# Patient Record
Sex: Male | Born: 1980 | Race: White | Hispanic: No | Marital: Married | Smoking: Former smoker
Health system: Southern US, Community
[De-identification: ages and names within clinical notes are randomized; demographics above are authoritative.]

## PROBLEM LIST (undated history)

## (undated) DIAGNOSIS — G47419 Narcolepsy without cataplexy: Secondary | ICD-10-CM

## (undated) DIAGNOSIS — N433 Hydrocele, unspecified: Secondary | ICD-10-CM

## (undated) DIAGNOSIS — I10 Essential (primary) hypertension: Secondary | ICD-10-CM

## (undated) DIAGNOSIS — G43909 Migraine, unspecified, not intractable, without status migrainosus: Secondary | ICD-10-CM

## (undated) DIAGNOSIS — M5020 Other cervical disc displacement, unspecified cervical region: Secondary | ICD-10-CM

## (undated) DIAGNOSIS — E785 Hyperlipidemia, unspecified: Secondary | ICD-10-CM

## (undated) DIAGNOSIS — M109 Gout, unspecified: Principal | ICD-10-CM

## (undated) HISTORY — DX: Gout, unspecified: M10.9

## (undated) HISTORY — PX: TONSILLECTOMY AND ADENOIDECTOMY: SUR1326

## (undated) HISTORY — DX: Narcolepsy without cataplexy: G47.419

## (undated) HISTORY — DX: Other cervical disc displacement, unspecified cervical region: M50.20

## (undated) HISTORY — PX: COSMETIC SURGERY: SHX468

## (undated) HISTORY — DX: Essential (primary) hypertension: I10

## (undated) HISTORY — DX: Migraine, unspecified, not intractable, without status migrainosus: G43.909

## (undated) HISTORY — DX: Hyperlipidemia, unspecified: E78.5

## (undated) HISTORY — DX: Hydrocele, unspecified: N43.3

---
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
## 2001-03-16 ENCOUNTER — Emergency Department (HOSPITAL_COMMUNITY): Admission: EM | Admit: 2001-03-16 | Discharge: 2001-03-16 | Payer: Self-pay | Admitting: Emergency Medicine

## 2001-06-04 ENCOUNTER — Ambulatory Visit (HOSPITAL_COMMUNITY): Admission: RE | Admit: 2001-06-04 | Discharge: 2001-06-04 | Payer: Self-pay | Admitting: *Deleted

## 2001-06-04 ENCOUNTER — Encounter: Payer: Self-pay | Admitting: *Deleted

## 2001-07-14 ENCOUNTER — Encounter: Payer: Self-pay | Admitting: Orthopedic Surgery

## 2001-07-14 ENCOUNTER — Ambulatory Visit (HOSPITAL_COMMUNITY): Admission: RE | Admit: 2001-07-14 | Discharge: 2001-07-14 | Payer: Self-pay | Admitting: Orthopedic Surgery

## 2003-09-17 ENCOUNTER — Emergency Department (HOSPITAL_COMMUNITY): Admission: EM | Admit: 2003-09-17 | Discharge: 2003-09-17 | Payer: Self-pay

## 2007-03-24 ENCOUNTER — Encounter: Admission: RE | Admit: 2007-03-24 | Discharge: 2007-03-24 | Payer: Self-pay | Admitting: Family Medicine

## 2007-10-15 ENCOUNTER — Encounter: Admission: RE | Admit: 2007-10-15 | Discharge: 2007-10-15 | Payer: Self-pay | Admitting: Specialist

## 2010-10-04 ENCOUNTER — Ambulatory Visit: Payer: Self-pay | Admitting: Specialist

## 2010-11-05 ENCOUNTER — Encounter (INDEPENDENT_AMBULATORY_CARE_PROVIDER_SITE_OTHER): Payer: BC Managed Care – PPO | Admitting: Vascular Surgery

## 2010-11-05 DIAGNOSIS — I774 Celiac artery compression syndrome: Secondary | ICD-10-CM

## 2010-11-06 NOTE — Consult Note (Signed)
NEW PATIENT CONSULTATION  Roger Brown, Roger Brown DOB:  1980/08/19                                       11/05/2010 NWGNF#:[id number]  Patient presents today for discussion regarding recent MRI findings suggesting celiac artery stenosis.  Patient is a very pleasant 30 year old gentleman who works as a Advice worker.  He has a history of hypertension since the [place] age of 22 and underwent an MRA of age 5 suggesting a possible mild renal artery stenosis.  He has had recent ongoing evaluation of hypertension and also migraine headaches and underwent a recent repeat MRA to determine if he was having progression of renal artery stenosis.  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  No other abnormalities were noted.  He does not have any specific symptoms referable to mesenteric ischemia, specifically food fear.  He does have occasional abdominal discomfort.  PAST MEDICAL HISTORY:  Significant for elevated blood pressure.  Only prior surgery was wisdom teeth extraction.  SOCIAL HISTORY:  He is married and does not have children.  He works as a Advice worker.  Quit smoking 5 years ago.  Does not drink alcohol.  FAMILY HISTORY:  Significant for transient ischemic attack in his mother and carotid artery stenosis in his father.  REVIEW OF SYSTEMS:  Reports a weight gain up to 213 pounds.  He is 5 feet 8 inches tall.  He does have some discomfort in his legs with walking. NEUROLOGIC:  Positive for headaches. GU:  No frequent urination. MUSCULOSKELETAL:  Arthritis, joint pain.  PHYSICAL EXAMINATION:  A well-developed and well-nourished white male appearing stated age in no acute distress.  Blood pressure is 147/101, pulse 99, respirations 18.  He is in no acute distress.  His abdomen is soft, nontender.  I do not hear a bruit or palpate any masses.  His radial, femoral, and pedal pulses  are 2+ bilaterally.  Neurologic:  No focal weakness or paresthesias.  Skin without ulcers or rashes. Musculoskeletal shows no major deformity or cyanosis.  I did have his MRA for review, and I have reviewed his actual films.  I would agree there is no evidence of renal artery stenosis, and there is some suggestion of potential proximal celiac artery stenosis.  On the lateral projections, this does appear to be compression by the median arcuate ligament.  I discussed this at length with patient and his wife present.  I explained that this would most likely be asymptomatic and would not recommend any further evaluation unless he had progressive GI symptoms. I did explain the next imaging study that I would recommend would be catheter-based arteriogram, feeling this would give more formation than a CT angiogram.  I do feel that this is asymptomatic and would only recommend further evaluation should he develop symptoms of mesenteric ischemia.  We will see him again on an as-needed basis.  He was reassured with this discussion.    Larina Earthly, M.D. Electronically Signed  TFE/MEDQ  D:  11/05/2010  T:  11/06/2010  Job:  [id number]

## 2011-08-29 ENCOUNTER — Other Ambulatory Visit: Payer: Self-pay | Admitting: Specialist

## 2011-08-29 LAB — CBC WITH DIFFERENTIAL/PLATELET
Basophil %: 0.6 %
Eosinophil #: 0 10*3/uL (ref 0.0–0.7)
Eosinophil %: 1 %
HCT: 42.3 % (ref 40.0–52.0)
HGB: 14.1 g/dL (ref 13.0–18.0)
Lymphocyte #: 1.5 10*3/uL (ref 1.0–3.6)
Lymphocyte %: 39 %
MCH: 29.7 pg (ref 26.0–34.0)
MCHC: 33.4 g/dL (ref 32.0–36.0)
Monocyte #: 0.2 10*3/uL (ref 0.0–0.7)
Neutrophil #: 2.3 10*3/uL (ref 1.4–6.5)

## 2011-08-29 LAB — PROTIME-INR
INR: 1
Prothrombin Time: 13.2 secs (ref 11.5–14.7)

## 2013-06-30 ENCOUNTER — Ambulatory Visit (INDEPENDENT_AMBULATORY_CARE_PROVIDER_SITE_OTHER): Payer: 59 | Admitting: Family Medicine

## 2013-06-30 ENCOUNTER — Encounter: Payer: Self-pay | Admitting: Family Medicine

## 2013-06-30 VITALS — BP 126/80 | HR 88 | Temp 98.9°F | Ht 68.0 in | Wt 203.5 lb

## 2013-06-30 DIAGNOSIS — G47419 Narcolepsy without cataplexy: Secondary | ICD-10-CM

## 2013-06-30 DIAGNOSIS — E669 Obesity, unspecified: Secondary | ICD-10-CM | POA: Insufficient documentation

## 2013-06-30 DIAGNOSIS — G43909 Migraine, unspecified, not intractable, without status migrainosus: Secondary | ICD-10-CM

## 2013-06-30 DIAGNOSIS — I10 Essential (primary) hypertension: Secondary | ICD-10-CM

## 2013-06-30 DIAGNOSIS — M109 Gout, unspecified: Secondary | ICD-10-CM

## 2013-06-30 MED ORDER — LOSARTAN POTASSIUM 50 MG PO TABS: 50.0000 mg | ORAL_TABLET | ORAL | Status: DC

## 2013-06-30 MED ORDER — HYDROCHLOROTHIAZIDE 25 MG PO TABS: 25.0000 mg | ORAL_TABLET | Freq: Every day | ORAL | Status: DC

## 2013-06-30 NOTE — Progress Notes (Signed)
Pre-visit discussion using our clinic review tool. No additional management support is needed unless otherwise documented below in the visit note.  

## 2013-06-30 NOTE — Patient Instructions (Signed)
CPX in 6 months 

## 2013-07-01 ENCOUNTER — Encounter: Payer: Self-pay | Admitting: Family Medicine

## 2013-07-01 DIAGNOSIS — G43909 Migraine, unspecified, not intractable, without status migrainosus: Secondary | ICD-10-CM

## 2013-07-01 DIAGNOSIS — Z8669 Personal history of other diseases of the nervous system and sense organs: Secondary | ICD-10-CM

## 2013-07-01 DIAGNOSIS — M109 Gout, unspecified: Secondary | ICD-10-CM | POA: Insufficient documentation

## 2013-07-01 DIAGNOSIS — G47419 Narcolepsy without cataplexy: Secondary | ICD-10-CM | POA: Insufficient documentation

## 2013-07-01 DIAGNOSIS — I10 Essential (primary) hypertension: Secondary | ICD-10-CM | POA: Insufficient documentation

## 2013-07-01 HISTORY — DX: Personal history of other diseases of the nervous system and sense organs: Z86.69

## 2013-07-01 HISTORY — DX: Gout, unspecified: M10.9

## 2013-07-01 HISTORY — DX: Migraine, unspecified, not intractable, without status migrainosus: G43.909

## 2013-07-01 NOTE — Progress Notes (Signed)
Date:  06/30/2013   Name:  Roger Brown   DOB:  31-Jan-1981   MRN:  [id number] Gender: male Age: 33 y.o.  Primary Physician:  Hannah Beat, MD   Chief Complaint: Establish Care   Subjective:   History of Present Illness:  Roger Brown is a 33 y.o. pleasant patient who presents with the following:  The patient is here to establish care as a new patient. He is relatively in good health. Does have hypertension that is well controlled on hydrochlorothiazide 25 mg as well as Cozaar 50 mg.  He also has migraines, and he does see his neurologist at least once or twice a year. He is currently on Depakote for migraine prophylaxis as well as Relpax.  He also has been troubled by gallops within the last 1-2 years significantly, and he currently intermittently takes Colcrys and Indocin. He does not use any type of gout prophylaxis, and he has never tried allopurinol, Uloric, or any of the others. He has been having some symptoms of gout almost continuously.  He also has narcolepsy, which was diagnosed by his neurologist after a great deal of testing. He is currently relatively well controlled on Ritalin 15 mg twice a day. He really did poorly when he tried Nuvigil and some of the other medications.  Patient Active Problem List   Diagnosis Date Noted  . Gout 07/01/2013  . Migraine 07/01/2013  . Unspecified essential hypertension 07/01/2013  . Narcolepsy 07/01/2013  . Obesity (BMI 30-39.9) 06/30/2013    Past Medical History  Diagnosis Date  . Hypertension   . Narcolepsy   . Hydrocele   . Herniated disc, cervical   . Gout 07/01/2013  . Migraine 07/01/2013    Past Surgical History  Procedure Laterality Date  . Cosmetic surgery    . Tonsillectomy and adenoidectomy      History   Social History  . Marital Status: Married    Spouse Name: N/A    Number of Children: N/A  . Years of Education: N/A   Occupational History  . nuclear tech [employer]    Social History Main Topics  . Smoking status: Former Smoker    Types: Cigarettes  . Smokeless tobacco: Never Used  . Alcohol Use: No  . Drug Use: No  . Sexual Activity: Yes    Partners: Female   Other Topics Concern  . Not on file   Social History Narrative   Former patient of Dr. Prince Rome          Family History  Problem Relation Age of Onset  . Arthritis Mother   . Arthritis Father   . Diabetes Father   . Lupus Father   . Gout Father   . Arthritis Maternal Grandmother   . Diabetes Maternal Grandmother   . Arthritis Maternal Grandfather   . Gout Maternal Grandfather   . Arthritis Paternal Grandmother   . Arthritis Paternal Grandfather   . Gout Brother   . Gout Brother     Allergies  Allergen Reactions  . Amoxicillin Hives and Swelling    Medication list has been reviewed and updated.  Review of Systems:   GEN: No acute illnesses, no fevers, chills. GI: No n/v/d, eating normally Pulm: No SOB Interactive and getting along well at home.  Otherwise, ROS is as per the HPI.  Objective:   Physical Examination: BP 126/80  Pulse 88  Temp(Src) 98.9 F (37.2 C) (Oral)  Ht 5\' 8"  (  1.727 m)  Wt 203 lb 8 oz (92.307 kg)  BMI 30.95 kg/m2  Ideal Body Weight: Weight in (lb) to have BMI = 25: 164.1   GEN: WDWN, NAD, Non-toxic, A & O x 3 HEENT: Atraumatic, Normocephalic. Neck supple. No masses, No LAD. Ears and Nose: No external deformity. CV: RRR, No M/G/R. No JVD. No thrill. No extra heart sounds. PULM: CTA B, no wheezes, crackles, rhonchi. No retractions. No resp. distress. No accessory muscle use. EXTR: No c/c/e NEURO Normal gait.  PSYCH: Normally interactive. Conversant. Not depressed or anxious appearing.  Calm demeanor.   Laboratory and Imaging Data:  Assessment & Plan:    Gout  Migraine  Unspecified essential hypertension  Narcolepsy  Refills given  Overall stable  Patient Instructions  CPX in 6 months   No orders of the defined  types were placed in this encounter.    New medications, updates to list, dose adjustments: Meds ordered this encounter  Medications  . divalproex (DEPAKOTE) 500 MG DR tablet    Sig: Take 1,000 mg by mouth at bedtime.  Marland Kitchen. DISCONTD: losartan (COZAAR) 50 MG tablet    Sig: Take 50 mg by mouth every morning.  Marland Kitchen. DISCONTD: hydrochlorothiazide (HYDRODIURIL) 25 MG tablet    Sig: Take 25 mg by mouth every morning.  . methylphenidate (RITALIN) 10 MG tablet    Sig: Take 15 mg by mouth 2 (two) times daily.  . colchicine (COLCRYS) 0.6 MG tablet    Sig: Take 0.6 mg by mouth as needed.  . indomethacin (INDOCIN) 50 MG capsule    Sig: Take 50 mg by mouth as needed.  . traMADol (ULTRAM) 50 MG tablet    Sig: Take 50 mg by mouth as needed.  . eletriptan (RELPAX) 40 MG tablet    Sig: Take 40 mg by mouth as needed for migraine or headache. One tablet by mouth at onset of headache. May repeat in 2 hours if headache persists or recurs.  . vitamin C (ASCORBIC ACID) 500 MG tablet    Sig: Take 500 mg by mouth 2 (two) times daily.  . hydrochlorothiazide (HYDRODIURIL) 25 MG tablet    Sig: Take 1 tablet (25 mg total) by mouth daily.    Dispense:  90 tablet    Refill:  3  . losartan (COZAAR) 50 MG tablet    Sig: Take 1 tablet (50 mg total) by mouth every morning.    Dispense:  90 tablet    Refill:  3    Signed,  Lemmie Vanlanen T. Karri Kallenbach, MD, CAQ Sports Medicine  [facility] [address] [place] [address] Phone: [phone] Fax: [phone]    Medication List       This list is accurate as of: 06/30/13 11:59 PM.  Always use your most recent med list.               COLCRYS 0.6 MG tablet  Generic drug:  colchicine  Take 0.6 mg by mouth as needed.     divalproex 500 MG DR tablet  Commonly known as:  DEPAKOTE  Take 1,000 mg by mouth at bedtime.     eletriptan 40 MG tablet  Commonly known as:  RELPAX  Take 40 mg by mouth as needed for migraine or headache. One tablet  by mouth at onset of headache. May repeat in 2 hours if headache persists or recurs.     hydrochlorothiazide 25 MG tablet  Commonly known as:  HYDRODIURIL  Take 1  tablet (25 mg total) by mouth daily.     indomethacin 50 MG capsule  Commonly known as:  INDOCIN  Take 50 mg by mouth as needed.     losartan 50 MG tablet  Commonly known as:  COZAAR  Take 1 tablet (50 mg total) by mouth every morning.     methylphenidate 10 MG tablet  Commonly known as:  RITALIN  Take 15 mg by mouth 2 (two) times daily.     traMADol 50 MG tablet  Commonly known as:  ULTRAM  Take 50 mg by mouth as needed.     vitamin C 500 MG tablet  Commonly known as:  ASCORBIC ACID  Take 500 mg by mouth 2 (two) times daily.

## 2013-08-27 ENCOUNTER — Encounter: Payer: Self-pay | Admitting: Family Medicine

## 2013-08-29 ENCOUNTER — Encounter: Payer: Self-pay | Admitting: Family Medicine

## 2013-08-29 MED ORDER — METHYLPHENIDATE HCL 10 MG PO TABS: 15.0000 mg | ORAL_TABLET | Freq: Two times a day (BID) | ORAL | Status: DC

## 2013-08-29 NOTE — Telephone Encounter (Signed)
Left message for Ivin BootyJoshua that prescription is ready to be picked up at front desk.

## 2013-08-29 NOTE — Addendum Note (Signed)
Addended by: Damita LackLORING, DONNA S on: 08/29/2013 03:29 PM   Modules accepted: Orders

## 2013-09-09 ENCOUNTER — Ambulatory Visit: Payer: Self-pay

## 2013-09-20 ENCOUNTER — Encounter: Payer: Self-pay | Admitting: Family Medicine

## 2013-09-21 ENCOUNTER — Other Ambulatory Visit: Payer: Self-pay | Admitting: Family Medicine

## 2013-09-21 MED ORDER — METHYLPHENIDATE HCL 10 MG PO TABS: 15.0000 mg | ORAL_TABLET | Freq: Two times a day (BID) | ORAL | Status: DC

## 2013-09-21 NOTE — Telephone Encounter (Signed)
Pt missed call; advised pt per Dr Cyndie Chimeopland's CMA ritalin rx ready for pick up at front desk.pt voiced understanding.

## 2013-09-28 ENCOUNTER — Telehealth: Payer: Self-pay

## 2013-09-28 ENCOUNTER — Encounter: Payer: Self-pay | Admitting: Family Medicine

## 2013-09-28 NOTE — Telephone Encounter (Signed)
i will call him tomorrow  

## 2013-09-28 NOTE — Telephone Encounter (Signed)
Pt left v/m; pt has been taking Ritalin for narcolepsy; pt wants to know if there is another med pt could take for narcolepsy; pt does not feel like Ritalin is effective all the time and pt "feels bad" a lot of the time; pt has periods that heart races; pt is monitoring BP an BP is OK. Pt request cb.

## 2013-09-29 MED ORDER — MODAFINIL 200 MG PO TABS: 200.0000 mg | ORAL_TABLET | Freq: Every day | ORAL | Status: DC

## 2013-09-29 NOTE — Telephone Encounter (Signed)
Unable to reach. Sent a message via MyChart

## 2013-09-29 NOTE — Telephone Encounter (Signed)
Called in and sent patient mychart message.

## 2013-09-29 NOTE — Addendum Note (Signed)
Addended by: Hannah BeatOPLAND, Kyaire Gruenewald on: 09/29/2013 07:43 PM   Modules accepted: Orders

## 2013-09-29 NOTE — Telephone Encounter (Signed)
Pt left v/m returning call from Dr Patsy Lageropland; pt said can return call at [phone] or if Dr Patsy Lageropland wants to proceed with med change [facility]pt request cb.

## 2013-09-29 NOTE — Telephone Encounter (Signed)
Taken care of

## 2013-11-01 ENCOUNTER — Encounter: Payer: Self-pay | Admitting: Family Medicine

## 2013-11-01 ENCOUNTER — Ambulatory Visit (INDEPENDENT_AMBULATORY_CARE_PROVIDER_SITE_OTHER): Payer: 59 | Admitting: Family Medicine

## 2013-11-01 VITALS — BP 122/84 | HR 71 | Temp 98.7°F | Ht 68.0 in | Wt 203.5 lb

## 2013-11-01 DIAGNOSIS — J029 Acute pharyngitis, unspecified: Secondary | ICD-10-CM

## 2013-11-01 LAB — POCT RAPID STREP A (OFFICE): Rapid Strep A Screen: NEGATIVE

## 2013-11-01 MED ORDER — GUAIFENESIN-CODEINE 100-10 MG/5ML PO SYRP: 5.0000 mL | ORAL_SOLUTION | Freq: Every evening | ORAL | Status: DC | PRN

## 2013-11-01 NOTE — Patient Instructions (Signed)
Ibuprofen 800 mg three times a day. Push fluids. Rest. Cough suppressant at night. Return to work 24 hours after fever resolved. Call if fever persistent > 4 days, shortness of breath.

## 2013-11-01 NOTE — Progress Notes (Signed)
Pre visit review using our clinic review tool, if applicable. No additional management support is needed unless otherwise documented below in the visit note. 

## 2013-11-01 NOTE — Progress Notes (Signed)
   Subjective:    Patient ID: Roger HitchJoshua B Vasey, male    DOB: 1981-03-04, 33 y.o.   MRN: [id number]  Sore Throat  This is a new problem. The current episode started yesterday. The problem has been gradually worsening. Neither side of throat is experiencing more pain than the other. The maximum temperature recorded prior to his arrival was 101 - 101.9 F. The fever has been present for less than 1 day. The pain is moderate. Associated symptoms include coughing, swollen glands and trouble swallowing. Pertinent negatives include no abdominal pain, ear discharge, hoarse voice, plugged ear sensation or shortness of breath. Associated symptoms comments: Myalgia, fatigue, sweats  mild nasal congestion  headache  no sinus pain  cough keeping up at night. He has had no exposure to strep or mono. 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 He has tried acetaminophen for the symptoms. The treatment provided no relief.      Review of Systems  Constitutional: Positive for fever and fatigue.  HENT: Positive for trouble swallowing. Negative for ear discharge and hoarse voice.   Respiratory: Positive for cough. Negative for shortness of breath and wheezing.   Cardiovascular: Negative for chest pain and leg swelling.  Gastrointestinal: Negative for abdominal pain.       Objective:   Physical Exam  Constitutional: Vital signs are normal. He appears well-developed and well-nourished.  Non-toxic appearance. He does not appear ill. No distress.  HENT:  Head: Normocephalic and atraumatic.  Right Ear: Hearing, tympanic membrane, external ear and ear canal normal. No tenderness. No foreign bodies. Tympanic membrane is not retracted and not bulging.  Left Ear: Hearing, tympanic membrane, external ear and ear canal normal. No tenderness. No foreign bodies. Tympanic membrane is not retracted and not bulging.  Nose: Nose normal. No mucosal edema or rhinorrhea.  Right sinus exhibits no maxillary sinus tenderness and no frontal sinus tenderness. Left sinus exhibits no maxillary sinus tenderness and no frontal sinus tenderness.  Mouth/Throat: Uvula is midline and mucous membranes are normal. Normal dentition. No dental caries. Posterior oropharyngeal erythema present. No oropharyngeal exudate, posterior oropharyngeal edema or tonsillar abscesses.  Eyes: Conjunctivae, EOM and lids are normal. Pupils are equal, round, and reactive to light. Lids are everted and swept, no foreign bodies found.  Neck: Trachea normal, normal range of motion and phonation normal. Neck supple. Carotid bruit is not present. No mass and no thyromegaly present.  Cardiovascular: Normal rate, regular rhythm, S1 normal, S2 normal, normal heart sounds, intact distal pulses and normal pulses.  Exam reveals no gallop.   No murmur heard. Pulmonary/Chest: Effort normal and breath sounds normal. No respiratory distress. He has no wheezes. He has no rhonchi. He has no rales.  Abdominal: Soft. Normal appearance and bowel sounds are normal. There is no hepatosplenomegaly. There is no tenderness. There is no rebound, no guarding and no CVA tenderness. No hernia.  Neurological: He is alert. He has normal reflexes.  Skin: Skin is warm, dry and intact. No rash noted.  Psychiatric: He has a normal mood and affect. His speech is normal and behavior is normal. Judgment normal.          Assessment & Plan:

## 2013-12-20 ENCOUNTER — Telehealth: Payer: Self-pay | Admitting: Family Medicine

## 2013-12-20 ENCOUNTER — Other Ambulatory Visit: Payer: Self-pay | Admitting: Family Medicine

## 2013-12-20 DIAGNOSIS — G43809 Other migraine, not intractable, without status migrainosus: Secondary | ICD-10-CM

## 2013-12-20 NOTE — Telephone Encounter (Signed)
Pt states that his neurologist's office called him yesterday and said since his insurance has changed, he heeds a referral for his appt on 12/29/13.  [place]

## 2013-12-20 NOTE — Telephone Encounter (Signed)
done

## 2013-12-21 ENCOUNTER — Other Ambulatory Visit: Payer: 59

## 2013-12-28 ENCOUNTER — Encounter: Payer: 59 | Admitting: Family Medicine

## 2013-12-30 ENCOUNTER — Encounter: Payer: Self-pay | Admitting: Family Medicine

## 2014-02-23 ENCOUNTER — Other Ambulatory Visit: Payer: Self-pay | Admitting: Family Medicine

## 2014-02-23 DIAGNOSIS — Z13220 Encounter for screening for lipoid disorders: Secondary | ICD-10-CM

## 2014-02-23 DIAGNOSIS — Z79899 Other long term (current) drug therapy: Secondary | ICD-10-CM

## 2014-02-23 DIAGNOSIS — M1009 Idiopathic gout, multiple sites: Secondary | ICD-10-CM

## 2014-02-28 ENCOUNTER — Other Ambulatory Visit: Payer: 59

## 2014-03-01 ENCOUNTER — Encounter: Payer: 59 | Admitting: Family Medicine

## 2014-04-07 ENCOUNTER — Other Ambulatory Visit: Payer: Self-pay

## 2014-06-02 ENCOUNTER — Other Ambulatory Visit: Payer: 59

## 2014-06-05 ENCOUNTER — Encounter: Payer: 59 | Admitting: Family Medicine

## 2014-07-07 ENCOUNTER — Telehealth: Payer: Self-pay | Admitting: Family Medicine

## 2014-07-07 NOTE — Telephone Encounter (Signed)
Last office visit 11/01/2013 with Dr. Ermalene SearingBedsole for sore throat.  Looks like he has cancelled four and no showed one appointment scheduled with Dr. Patsy Lageropland.  No future appointments scheduled.  Ok to refill?

## 2014-07-09 NOTE — Telephone Encounter (Signed)
Please schedule follow up with Dr. Patsy Lageropland in the next month.  Please let patient know he must keep this appointment in order for us to keep refilling his medications.

## 2014-07-09 NOTE — Telephone Encounter (Signed)
Ok to refill #30, 0 refills.  F/u in the next month. Skipped appt, cancelled 4 appointments.

## 2014-07-11 NOTE — Telephone Encounter (Signed)
Left message asking pt to call office  °

## 2014-07-11 NOTE — Telephone Encounter (Signed)
Appointment 2/22 pt aware Please close

## 2014-08-14 ENCOUNTER — Encounter: Payer: Self-pay | Admitting: Family Medicine

## 2014-08-14 ENCOUNTER — Other Ambulatory Visit: Payer: Self-pay | Admitting: Family Medicine

## 2014-08-14 ENCOUNTER — Ambulatory Visit (INDEPENDENT_AMBULATORY_CARE_PROVIDER_SITE_OTHER): Payer: 59 | Admitting: Family Medicine

## 2014-08-14 VITALS — BP 126/90 | HR 96 | Temp 98.4°F | Ht 68.0 in | Wt 216.8 lb

## 2014-08-14 DIAGNOSIS — M2550 Pain in unspecified joint: Secondary | ICD-10-CM

## 2014-08-14 DIAGNOSIS — I10 Essential (primary) hypertension: Secondary | ICD-10-CM

## 2014-08-14 MED ORDER — LOSARTAN POTASSIUM 50 MG PO TABS: 50.0000 mg | ORAL_TABLET | Freq: Every day | ORAL | Status: DC

## 2014-08-14 MED ORDER — HYDROCHLOROTHIAZIDE 25 MG PO TABS: 25.0000 mg | ORAL_TABLET | Freq: Every day | ORAL | Status: DC

## 2014-08-14 NOTE — Progress Notes (Signed)
Pre visit review using our clinic review tool, if applicable. No additional management support is needed unless otherwise documented below in the visit note. 

## 2014-08-14 NOTE — Progress Notes (Signed)
Dr. Karleen Hampshire T. Kamron Vanwyhe, MD, CAQ Sports Medicine Primary [facility] and Sports [facility] [address] [place], [address] Phone: [phone] Fax: [phone]  08/14/2014  Patient: Roger Brown, MRN: [id number], DOB: Nov 20, 1980, 34 y.o.  Primary Physician:  Hannah Beat, MD  Chief Complaint: Medication Refill and Knuckle pain  Subjective:   [name] is a 34 y.o. very pleasant male patient who presents with the following:  BP meds HTN: Tolerating all medications without side effects Stable and at goal No CP, no sob. No HA.  BP Readings from Last 3 Encounters:  08/14/14 126/90  11/01/13 122/84  06/30/13 126/80     Joint pain in knuckles and some in wrist. Some MCP swelling.  Known gout.  Father has lupus.  Gets some dry mouth.  Pain in multiple joints, both wrists, MCP Joints, feet and ankles.  Currently he is not adding any symptoms is today, but he does note that particular his MCPs are swollen sometimes.  There are red, and do not feel like his prior gout attacks.  Past Medical History, Surgical History, Social History, Family History, Problem List, Medications, and Allergies have been reviewed and updated if relevant.   GEN: No acute illnesses, no fevers, chills. GI: No n/v/d, eating normally Pulm: No SOB Interactive and getting along well at home.  Otherwise, ROS is as per the HPI.  Objective:   BP 126/90 mmHg  Pulse 96  Temp(Src) 98.4 F (36.9 C) (Oral)  Ht  (1.727 m)  Wt 216 lb 12 oz (98.317 kg)  BMI 32.96 kg/m2  GEN: WDWN, NAD, Non-toxic, A & O x 3 HEENT: Atraumatic, Normocephalic. Neck supple. No masses, No LAD. Ears and Nose: No external deformity. CV: RRR, No M/G/R. No JVD. No thrill. No extra heart sounds. PULM: CTA B, no wheezes, crackles, rhonchi. No retractions. No resp. distress. No accessory muscle use. EXTR: No c/c/e NEURO Normal gait.  PSYCH: Normally interactive. Conversant. Not depressed or anxious appearing.  Calm  demeanor.   No significant synovitis.  No significant tenosynovitis.  A somewhat range of motion at the wrists, elbow, back, and knees.  No effusion.  Laboratory and Imaging Data: Results for orders placed or performed in visit on 08/14/14  Cyclic citrul peptide antibody, IgG  Result Value Ref Range   Cyclic Citrullin Peptide Ab  0.0 - 5.0 U/mL  Rheumatoid factor  Result Value Ref Range   Rhuematoid fact SerPl-aCnc <10 <=14 IU/mL  ANA  Result Value Ref Range   Anit Nuclear Antibody(ANA)  NEGATIVE     Assessment and Plan:   Polyarthralgia - Plan: Sedimentation rate, High sensitivity CRP, Cyclic citrul peptide antibody, IgG, Rheumatoid factor, ANA  Essential hypertension  Polyarthralgia, history of lupus in father.  Basic rheumatological workup.  Refilled blood pressure medication  Follow-up: No Follow-up on file.  New Prescriptions   No medications on file   Orders Placed This Encounter  Procedures  . Sedimentation rate  . High sensitivity CRP  . Cyclic citrul peptide antibody, IgG  . Rheumatoid factor  . ANA    Signed,  Karleen Hampshire T. Yasin Ducat, MD   Patient's Medications  New Prescriptions   No medications on file  Previous Medications   ALLOPURINOL (ZYLOPRIM) 100 MG TABLET    Take 100 mg by mouth 2 (two) times daily.   ARMODAFINIL 250 MG TABLET    Take 250 mg by mouth every morning.   COLCHICINE (COLCRYS) 0.6 MG TABLET    Take 0.6 mg  by mouth as needed.   DIVALPROEX (DEPAKOTE) 500 MG DR TABLET    Take 500 mg in the morning and 1000 mg at bedtime.   ELETRIPTAN (RELPAX) 40 MG TABLET    Take 40 mg by mouth as needed for migraine or headache. One tablet by mouth at onset of headache. May repeat in 2 hours if headache persists or recurs.   VITAMIN C (ASCORBIC ACID) 500 MG TABLET    Take 500 mg by mouth 2 (two) times daily.  Modified Medications   Modified Medication Previous Medication   HYDROCHLOROTHIAZIDE (HYDRODIURIL) 25 MG TABLET hydrochlorothiazide (HYDRODIURIL)  25 MG tablet      Take 1 tablet (25 mg total) by mouth daily.    Take 1 tablet (25 mg total) by mouth daily.   LOSARTAN (COZAAR) 50 MG TABLET losartan (COZAAR) 50 MG tablet      Take 1 tablet (50 mg total) by mouth daily.    Take 1 tablet (50 mg total) by mouth daily.  Discontinued Medications   DIVALPROEX (DEPAKOTE) 500 MG DR TABLET    Take 1,000 mg by mouth at bedtime.   GUAIFENESIN-CODEINE (ROBITUSSIN AC) 100-10 MG/5ML SYRUP    Take 5-10 mLs by mouth at bedtime as needed for cough.   INDOMETHACIN (INDOCIN) 50 MG CAPSULE    Take 50 mg by mouth as needed.   MODAFINIL (PROVIGIL) 200 MG TABLET    Take 1 tablet (200 mg total) by mouth daily.

## 2014-08-15 LAB — CYCLIC CITRUL PEPTIDE ANTIBODY, IGG

## 2014-08-15 LAB — HIGH SENSITIVITY CRP: CRP HIGH SENSITIVITY: 2.6 mg/L (ref 0.000–5.000)

## 2014-08-15 LAB — RHEUMATOID FACTOR: Rhuematoid fact SerPl-aCnc: <10 IU/mL (ref <=14)

## 2014-08-15 LAB — SEDIMENTATION RATE: Sed Rate: 8 mm/hr (ref 0–22)

## 2014-08-16 LAB — ANA: Anti Nuclear Antibody(ANA): NEGATIVE

## 2014-12-22 ENCOUNTER — Ambulatory Visit: Payer: 59 | Admitting: Family Medicine

## 2014-12-22 ENCOUNTER — Telehealth: Payer: Self-pay | Admitting: Family Medicine

## 2014-12-22 ENCOUNTER — Encounter: Payer: Self-pay | Admitting: Family Medicine

## 2014-12-22 ENCOUNTER — Ambulatory Visit (INDEPENDENT_AMBULATORY_CARE_PROVIDER_SITE_OTHER)
Admission: RE | Admit: 2014-12-22 | Discharge: 2014-12-22 | Disposition: A | Discharge status: Home / Self Care | Payer: 59 | Source: Ambulatory Visit | Attending: Family Medicine | Admitting: Family Medicine

## 2014-12-22 ENCOUNTER — Ambulatory Visit (INDEPENDENT_AMBULATORY_CARE_PROVIDER_SITE_OTHER): Payer: 59 | Admitting: Family Medicine

## 2014-12-22 VITALS — BP 141/100 | HR 89 | Temp 98.7°F | Ht 68.0 in | Wt 219.5 lb

## 2014-12-22 DIAGNOSIS — M546 Pain in thoracic spine: Secondary | ICD-10-CM

## 2014-12-22 DIAGNOSIS — R509 Fever, unspecified: Secondary | ICD-10-CM | POA: Diagnosis not present

## 2014-12-22 DIAGNOSIS — M2550 Pain in unspecified joint: Secondary | ICD-10-CM | POA: Insufficient documentation

## 2014-12-22 MED ORDER — GABAPENTIN 100 MG PO CAPS: ORAL_CAPSULE | ORAL | Status: DC

## 2014-12-22 MED ORDER — VALACYCLOVIR HCL 1 G PO TABS: 1000.0000 mg | ORAL_TABLET | Freq: Three times a day (TID) | ORAL | Status: DC

## 2014-12-22 NOTE — Progress Notes (Signed)
Subjective:    Patient ID: Roger HitchJoshua B Brown, male    DOB: Feb 02, 1981, 34 y.o.   MRN: [id number]  HPI  34 year old male with history of gout, HTN and obesity present at request from Dr. Alice ReichertWhitfield ORTHO for back pain.   [facility] lower [facility] region acutely. Pain was sudden onset 2 days ago, very severe, left greater than right. Could not get comfortable, flu like symptoms, hot and chilly. Was found to have a fever of 100.6 in ORTHO office X-ray showed compression fracture.. May be old wrestling injury. Npt clearly causing pain per ortho. Dr.Whitfield did not believe pain was from muscle spasm.  Sent for cbc, sed rate and UA.Marland Kitchen. All nml.  Followed up with ortho today: fever resolved but still concern about ongoing atypical back pain. Pain is currently 10/10 on pain scale, pt having trouble walking.,  [facility] not [facility] much. Left knee hurting a little now. Ortho felt maybe this was due to shingles given pt history of this as a child and current presentation. He asked our office to eval and manage the pt.   He has also been seen previously in 07/2014 by Dr. Patsy Lageropland for polyarthralgia  and HTN  .  Noted reviewed: Pain in multiple joints, both wrists, MCP Joints, feet and ankles.  MCPs are swollen sometimes. There are red, and do not feel like his prior gout attacks. These symptoms now come and go but are not bad. Pt had nml sed rate, CRP, anti ccp antibody, RF, ANA in 07/2014   Personal history of back issues, usually low back and herniated disc in neck.  He also had shingles as age 89, no rash at that time.  hx of lupus in father  No proceeding cold symptoms, or strep. No rash noted.  Review of Systems  HENT: Negative for ear pain, hearing loss and sinus pressure.   Eyes: Negative for pain.  Respiratory: Negative for cough, shortness of breath and wheezing.   Cardiovascular: Negative for chest pain and leg swelling.    Genitourinary: Negative for dysuria, urgency, frequency and hematuria.       Objective:   Physical Exam  Constitutional: He is oriented to person, place, and time. Vital signs are normal. He appears well-developed and well-nourished.  HENT:  Head: Normocephalic.  Right Ear: Hearing normal.  Left Ear: Hearing normal.  Nose: Nose normal.  Mouth/Throat: Oropharynx is clear and moist and mucous membranes are normal.  Neck: Trachea normal. Carotid bruit is not present. No thyroid mass and no thyromegaly present.  Cardiovascular: Normal rate, regular rhythm and normal pulses.  Exam reveals no gallop, no distant heart sounds and no friction rub.   No murmur heard. No peripheral edema  Pulmonary/Chest: Effort normal and breath sounds normal. No respiratory distress.  Musculoskeletal:       Arms: Circle at location of hypersensitivity and pain. No vertebral ttp.  Moving does in crease pain.  Neurological: He is alert and oriented to person, place, and time. He has normal strength. No cranial nerve deficit or sensory deficit. He exhibits normal muscle tone. He displays a negative Romberg sign. Gait abnormal.  Pt is walking crouched over given severe pain in back  Skin: Skin is warm, dry and intact. No rash noted.  Psychiatric: He has a normal mood and affect. His speech is normal and behavior is normal. Thought content normal.  Assessment & Plan:

## 2014-12-22 NOTE — Assessment & Plan Note (Signed)
Unclear if currently connected with current symptoms.

## 2014-12-22 NOTE — Patient Instructions (Signed)
Start treatment with antiviral.  Start gabapentin for possible neuropathic pain. Continue percocet for breakthrough pain.  Follow up early next week with PCP.

## 2014-12-22 NOTE — Progress Notes (Signed)
Pre visit review using our clinic review tool, if applicable. No additional management support is needed unless otherwise documented below in the visit note. 

## 2014-12-22 NOTE — Assessment & Plan Note (Signed)
Severe pain.. Ortho felt not due to vertebral issue and pt without vertebral ttp.  Not clearly consistent with muscle spasm. Recent polyarthralgia work up is negative, no clear connection with current symptoms. Very hypersensitive to touch and in dermatome. Pt with history of shingles.. Could be atypical shingles.  No further fever.. Neg lab eval with no clear sign of infection, UA clear and no urinary symptoms. CXR clear. Thoracic film unrevealing.  Will treat with antiviral for possible atypical shingles. Start gabapentin for neuropathic pain from either back issue or herpetic neuralgia.  Pt has percocet and muscle relaxant to use prn.  Close follow up next week, pt instructed to call if fever recurs or new symptoms.

## 2014-12-22 NOTE — Telephone Encounter (Signed)
Call [facility].  temp 100.6 yesterday. Nml on recheck today  Seen  In office for back pain  UA, cbc, sed rate nml.  NO injury  HX of shingles   Dr. Cleophas DunkerWhitfield will send records, wants him seen today.  Appt made at 12:15 today.

## 2014-12-26 ENCOUNTER — Telehealth: Payer: Self-pay | Admitting: Family Medicine

## 2014-12-26 ENCOUNTER — Encounter: Payer: Self-pay | Admitting: Family Medicine

## 2014-12-26 ENCOUNTER — Ambulatory Visit (INDEPENDENT_AMBULATORY_CARE_PROVIDER_SITE_OTHER): Payer: 59 | Admitting: Family Medicine

## 2014-12-26 VITALS — BP 124/74 | HR 75 | Temp 97.9°F | Ht 68.0 in | Wt 215.0 lb

## 2014-12-26 DIAGNOSIS — M546 Pain in thoracic spine: Secondary | ICD-10-CM

## 2014-12-26 DIAGNOSIS — M2550 Pain in unspecified joint: Secondary | ICD-10-CM

## 2014-12-26 DIAGNOSIS — M25562 Pain in left knee: Secondary | ICD-10-CM | POA: Diagnosis not present

## 2014-12-26 LAB — CBC WITH DIFFERENTIAL/PLATELET
BASOS ABS: 0 10*3/uL (ref 0.0–0.1)
Basophils Relative: 0.5 % (ref 0.0–3.0)
EOS ABS: 0.1 10*3/uL (ref 0.0–0.7)
Eosinophils Relative: 2 % (ref 0.0–5.0)
HCT: 40.6 % (ref 39.0–52.0)
Hemoglobin: 13.7 g/dL (ref 13.0–17.0)
Lymphocytes Relative: 32.3 % (ref 12.0–46.0)
Lymphs Abs: 2 10*3/uL (ref 0.7–4.0)
MCHC: 33.8 g/dL (ref 30.0–36.0)
MCV: 86.7 fl (ref 78.0–100.0)
MONO ABS: 0.4 10*3/uL (ref 0.1–1.0)
Monocytes Relative: 6.8 % (ref 3.0–12.0)
NEUTROS PCT: 58.4 % (ref 43.0–77.0)
Neutro Abs: 3.6 10*3/uL (ref 1.4–7.7)
PLATELETS: 318 10*3/uL (ref 150.0–400.0)
RBC: 4.69 Mil/uL (ref 4.22–5.81)
RDW: 13.4 % (ref 11.5–15.5)
WBC: 6.2 10*3/uL (ref 4.0–10.5)

## 2014-12-26 LAB — URIC ACID: Uric Acid, Serum: 5.6 mg/dL (ref 4.0–7.8)

## 2014-12-26 MED ORDER — DICLOFENAC SODIUM 75 MG PO TBEC: 75.0000 mg | DELAYED_RELEASE_TABLET | Freq: Two times a day (BID) | ORAL | Status: DC

## 2014-12-26 NOTE — Telephone Encounter (Signed)
Completed in outbox.

## 2014-12-26 NOTE — Assessment & Plan Note (Signed)
Improving. Unclear if due to time and or percocet or if due to valacyclovir.  Given fever few days ago still concern about atypical infectious causes of joint and back pain.  Will re-eval with cbc, lyme test and ASO titer.  Reassuring that he has had no fever without percocet in last 8 hours.

## 2014-12-26 NOTE — Progress Notes (Signed)
   Subjective:    Patient ID: Roger Brown, male    DOB: 10-25-1980, 34 y.o.   MRN: [id number]  HPI 34 year old male pt of Dr. Cyndie Chimeopland's presents for 4 day follow up on severe atypical left sided thoracicback back and  Fever.  Fever lasted only 1 day. [facility].. Cbc, sed rate and UA were normal.  Xray thoracic spine: clear except  Old compression fracture. CXR in our office neg. Started on treatment for shingles given presentation and history.  Gabapentin for neuropathic pain, percocet .  BP was very elevated last OV as pt in pain. BP Readings from Last 3 Encounters:  12/26/14 124/74  12/22/14 141/100  08/14/14 126/90     Pt today reports he is feeling better overall. 50% better.  Has started valtrex.  Less back pain unless someone touches area. He has had more knee pain (feels like his typical gout except it is usually in his big toe)  No other current joint pain.  Stiff all over given not walking around much.  Gabapentin at night is giving him a migraine, needing to take imitrex  to treat  pain. Headache goes away with this treatment. Using percocet 1 tab once every 8-10 hours. (No fever since Saturday 100.8) NO fever in last 12 hours when last percocet dose was.. Mild nausea , no  appetite. Has noted facial twitching off and on under left eye.      Review of Systems  Constitutional: Positive for fever. Negative for fatigue.  HENT: Negative for ear pain.   Eyes: Negative for pain.  Respiratory: Negative for shortness of breath and wheezing.   Cardiovascular: Negative for chest pain and leg swelling.  Gastrointestinal: Negative for abdominal pain.  Genitourinary: Negative for dysuria.       Objective:   Physical Exam  Constitutional: He is oriented to person, place, and time. Vital signs are normal. He appears well-developed and well-nourished.  HENT:  Head: Normocephalic.  Right Ear: Hearing normal.  Left Ear: Hearing normal.  Nose: Nose normal.   Mouth/Throat: Oropharynx is clear and moist and mucous membranes are normal.  Neck: Trachea normal. Carotid bruit is not present. No thyroid mass and no thyromegaly present.  Cardiovascular: Normal rate, regular rhythm and normal pulses.  Exam reveals no gallop, no distant heart sounds and no friction rub.   No murmur heard. No peripheral edema  Pulmonary/Chest: Effort normal and breath sounds normal. No respiratory distress.  Musculoskeletal:       Left knee: He exhibits decreased range of motion and swelling. He exhibits no erythema and normal meniscus. Tenderness found. Medial joint line and lateral joint line tenderness noted.       Arms: Circle at location of hypersensitivity and pain. No vertebral ttp.  Moving does in crease pain.  Neurological: He is alert and oriented to person, place, and time. He has normal strength. No cranial nerve deficit or sensory deficit. He exhibits normal muscle tone. He displays a negative Romberg sign. Gait normal.  Gait now normal  Skin: Skin is warm, dry and intact. No rash noted.  Psychiatric: He has a normal mood and affect. His speech is normal and behavior is normal. Thought content normal.          Assessment & Plan:

## 2014-12-26 NOTE — Assessment & Plan Note (Signed)
?   Pain due to gout flare versus irritation from abnormal gait recently.  UNclear if connected to recent polyarthralgia and or back pain.  Will check uric acid.. At last check 7 on allopurinol.  Pt with no recent high purine foods.

## 2014-12-26 NOTE — Telephone Encounter (Signed)
[employer] faxed over FMLA paperwork In Dr Ermalene SearingBedsole IN BOX For your review and signature

## 2014-12-26 NOTE — Progress Notes (Signed)
Pre visit review using our clinic review tool, if applicable. No additional management support is needed unless otherwise documented below in the visit note. 

## 2014-12-26 NOTE — Patient Instructions (Addendum)
Stop at lab on way out. Stop gabapentin. Continue valacyclovir. Start diclofenac 75 mg twice daily for inflammation. Use percocet for break thorugh pain. Remain out of work until 12/28/2014, call if pain not improving as expected.

## 2014-12-27 LAB — ANTISTREPTOLYSIN O TITER: ASO: 116 IU/mL (ref <409)

## 2014-12-27 LAB — B. BURGDORFI ANTIBODIES: B burgdorferi Ab IgG+IgM: 0.04 {ISR}

## 2014-12-27 NOTE — Telephone Encounter (Signed)
Faxed paper work to [phone]  Pt aware fmla paperwork has been faxed  Copy to scan Copy to pt Copy for file

## 2015-01-17 ENCOUNTER — Ambulatory Visit (INDEPENDENT_AMBULATORY_CARE_PROVIDER_SITE_OTHER): Payer: 59 | Admitting: Family Medicine

## 2015-01-17 ENCOUNTER — Encounter: Payer: Self-pay | Admitting: Family Medicine

## 2015-01-17 VITALS — BP 122/74 | HR 92 | Temp 99.1°F | Ht 68.0 in | Wt 215.5 lb

## 2015-01-17 DIAGNOSIS — M546 Pain in thoracic spine: Secondary | ICD-10-CM | POA: Diagnosis not present

## 2015-01-17 DIAGNOSIS — R509 Fever, unspecified: Secondary | ICD-10-CM

## 2015-01-17 MED ORDER — HYDROCODONE-ACETAMINOPHEN 5-325 MG PO TABS: 1.0000 | ORAL_TABLET | Freq: Four times a day (QID) | ORAL | Status: DC | PRN

## 2015-01-17 NOTE — Patient Instructions (Signed)

## 2015-01-17 NOTE — Progress Notes (Signed)
Pre visit review using our clinic review tool, if applicable. No additional management support is needed unless otherwise documented below in the visit note. 

## 2015-01-17 NOTE — Progress Notes (Signed)
Dr. Karleen Hampshire T. Marx Doig, MD, CAQ [facility] [address] [place], [address] Phone: [phone] Fax: [phone]  01/17/2015  Patient: Roger Brown, MRN: [id number], DOB: 09-05-1980, 34 y.o.  Primary Physician:  Hannah Beat, MD  Chief Complaint: Back Pain   The patient has had a protracted history of back pain without a clear source, and additionally he has had fevers up to 102F.  He is also had some polyarthralgias and generalized aching and feeling poorly.  He is seeing my partner twice.  He additionally has seen Dr. Cleophas Dunker from orthopedics.  He initially referred him to our office for further medical evaluation.  Prior history is detailed below  He did improve for about a week and was feeling better, but now he is also feeling poorly generally again.  He started to have pain in his thoracic region again, now is having 6 out of 10 pain.  He doesn't have any known trauma or accident.  He initially was treated with some Valtrex for potential zoster, however he had no rash throughout the entirety of his course of symptoms.  Initial Lyme titers were negative.  Last night started to get some severe pain, more midline, along the side and pain down in the lower back. Protecting side some. Muscle tension in his back.  Started to get some pain on the other side. Touch, pressure, lifting all causing a lot of pain.   Previously was at an 8/10, now at a six.  12/26/2014 Last OV with Kerby Nora, MD  HPI 34 year old male pt of Dr. Cyndie Chime presents for 4 day follow up on severe atypical left sided thoracicback back and  Fever.  Fever lasted only 1 day. At [facility] visits last week.. Cbc, sed rate and UA were normal.  Xray thoracic spine: clear except  Old compression fracture. CXR in our office neg. Started on treatment for shingles given presentation and history.  Gabapentin for neuropathic pain, percocet .  BP was very elevated last OV as  pt in pain. BP Readings from Last 3 Encounters:  01/17/15 122/74  12/26/14 124/74  12/22/14 141/100     Pt today reports he is feeling better overall. 50% better.  Has started valtrex.  Less back pain unless someone touches area. He has had more knee pain (feels like his typical gout except it is usually in his big toe)  No other current joint pain.  Stiff all over given not walking around much.  Gabapentin at night is giving him a migraine, needing to take imitrex  to treat  pain. Headache goes away with this treatment. Using percocet 1 tab once every 8-10 hours. (No fever since Saturday 100.8) NO fever in last 12 hours when last percocet dose was.. Mild nausea , no  appetite. Has noted facial twitching off and on under left eye.  Patient Active Problem List   Diagnosis Date Noted  . Left knee pain 12/26/2014  . Left-sided thoracic back pain 12/22/2014  . Polyarthralgia 12/22/2014  . Gout 07/01/2013  . Migraine 07/01/2013  . Essential hypertension 07/01/2013  . Narcolepsy 07/01/2013  . Obesity (BMI 30-39.9) 06/30/2013    Past Medical History  Diagnosis Date  . Hypertension   . Narcolepsy   . Hydrocele   . Herniated disc, cervical   . Gout 07/01/2013  . Migraine 07/01/2013    Past Surgical History  Procedure Laterality Date  . Cosmetic surgery    . Tonsillectomy and  adenoidectomy      History   Social History  . Marital Status: Married    Spouse Name: N/A  . Number of Children: N/A  . Years of Education: N/A   Occupational History  . nuclear tech [employer]   Social History Main Topics  . Smoking status: Former Smoker    Types: Cigarettes  . Smokeless tobacco: Never Used  . Alcohol Use: No  . Drug Use: No  . Sexual Activity:    Partners: Female   Other Topics Concern  . Not on file   Social History Narrative   Former patient of Dr. Prince Rome          Family History  Problem Relation Age of Onset  . Arthritis Mother   .  Arthritis Father   . Diabetes Father   . Lupus Father   . Gout Father   . Arthritis Maternal Grandmother   . Diabetes Maternal Grandmother   . Arthritis Maternal Grandfather   . Gout Maternal Grandfather   . Arthritis Paternal Grandmother   . Arthritis Paternal Grandfather   . Gout Brother   . Gout Brother     Allergies  Allergen Reactions  . Amoxicillin Hives and Swelling    Medication list reviewed and updated in full in [place] [facility].   Review of Systems  Constitutional: Positive for fever previously. POSITIVE for fatigue. + arthralgia HENT: Negative for ear pain.   Eyes: Negative for pain.  Respiratory: Negative for shortness of breath and wheezing.   Cardiovascular: Negative for chest pain and leg swelling.  Gastrointestinal: Negative for abdominal pain.  Genitourinary: Negative for dysuria.       Objective:   Physical Exam   Filed Vitals:   01/17/15 1602  BP: 122/74  Pulse: 92  Temp: 99.1 F (37.3 C)  TempSrc: Oral  Height: 5\' 8"  (1.727 m)  Weight: 215 lb 8 oz (97.75 kg)     Constitutional: He is oriented to person, place, and time. Vital signs are normal. He appears well-developed and well-nourished.  HENT:  Head: Normocephalic.  Right Ear: Hearing normal.  Left Ear: Hearing normal.  Nose: Nose normal.  Mouth/Throat: Oropharynx is clear and moist and mucous membranes are normal.  Neck: Trachea normal. Carotid bruit is not present. No thyroid mass and no thyromegaly present.  Cardiovascular: Normal rate, regular rhythm and normal pulses.  Exam reveals no gallop, no distant heart sounds and no friction rub.   No murmur heard. No peripheral edema  Pulmonary/Chest: Effort normal and breath sounds normal. No respiratory distress.  Musculoskeletal:       Left knee: He exhibits decreased range of motion and swelling. He exhibits no erythema and normal meniscus. Tenderness found. Medial joint line and lateral joint line tenderness noted.        Arms: Circle at location of hypersensitivity and pain. TTP at spinous processes around T9-T11  Moving does in crease pain markedly.  There is some restriction of motion at the waist with flexion and extension.  Rotational movements also cause some pain, more on the left.  Neurological: He is alert and oriented to person, place, and time. He has normal strength. No cranial nerve deficit or sensory deficit. He exhibits normal muscle tone. He displays a negative Romberg sign. Gait normal.  Gait now normal  Skin: Skin is warm, dry and intact. No rash noted.  Psychiatric: He has a normal mood and affect. His speech is normal and behavior  is normal. Thought content normal.   Results for orders placed or performed in visit on 12/26/14  CBC with Differential/Platelet  Result Value Ref Range   WBC 6.2 4.0 - 10.5 K/uL   RBC 4.69 4.22 - 5.81 Mil/uL   Hemoglobin 13.7 13.0 - 17.0 g/dL   HCT 16.1 09.6 - 04.5 %   MCV 86.7 78.0 - 100.0 fl   MCHC 33.8 30.0 - 36.0 g/dL   RDW 40.9 81.1 - 91.4 %   Platelets 318.0 150.0 - 400.0 K/uL   Neutrophils Relative % 58.4 43.0 - 77.0 %   Lymphocytes Relative 32.3 12.0 - 46.0 %   Monocytes Relative 6.8 3.0 - 12.0 %   Eosinophils Relative 2.0 0.0 - 5.0 %   Basophils Relative 0.5 0.0 - 3.0 %   Neutro Abs 3.6 1.4 - 7.7 K/uL   Lymphs Abs 2.0 0.7 - 4.0 K/uL   Monocytes Absolute 0.4 0.1 - 1.0 K/uL   Eosinophils Absolute 0.1 0.0 - 0.7 K/uL   Basophils Absolute 0.0 0.0 - 0.1 K/uL  Antistreptolysin O titer  Result Value Ref Range   ASO 116 <409 IU/mL  B. Burgdorfi Antibodies  Result Value Ref Range   B burgdorferi Ab IgG+IgM 0.04 ISR  Uric acid  Result Value Ref Range   Uric Acid, Serum 5.6 4.0 - 7.8 mg/dL    Dg Chest 2 View  12/29/2954   CLINICAL DATA:  Left-sided chest pain  EXAM: CHEST  2 VIEW  COMPARISON:  None.  FINDINGS: Lungs are clear. Heart size and pulmonary vascularity are normal. No adenopathy. No pneumothorax. There is mild upper thoracic levoscoliosis.   IMPRESSION: No edema or consolidation.   Electronically Signed   By: Bretta Bang III M.D.   On: 12/22/2014 13:26        Assessment & Plan:   Thoracic spine pain - Plan: Rocky mtn spotted fvr abs pnl(IgG+IgM), Ehrlichia Antibody Panel, MR Thoracic Spine Wo Contrast  Other specified fever - Plan: Rocky mtn spotted fvr abs pnl(IgG+IgM), Ehrlichia Antibody Panel, MR Thoracic Spine Wo Contrast  Waxing and waning highly atypical thoracic spine pain with fever up to 102F.  Waxing and waning myalgias and polyarthralgias.  Complete tick borne illness workup, and check for RMSF and Ehrlichiosis  Given history, history of fever, waxing and waning pain, obtain an MRI of the thoracic spine to evaluate for potential abscess, discitis, osteomyelitis, or other acute process that could explain ongoing atypical back pain along with fever.  MRI will also help to assess age of compression fracture.  Previous x-rays were done at his orthopedist's office by Dr. Cleophas Dunker at the onset of the symptoms.  Follow-up: No Follow-up on file.  New Prescriptions   HYDROCODONE-ACETAMINOPHEN (NORCO/VICODIN) 5-325 MG PER TABLET    Take 1 tablet by mouth every 6 (six) hours as needed for moderate pain.   Orders Placed This Encounter  Procedures  . MR Thoracic Spine Wo Contrast  . Rocky mtn spotted fvr abs pnl(IgG+IgM)  . Ehrlichia Antibody Panel    Signed,  Blaiden Werth T. Jannell Franta, MD   Patient's Medications  New Prescriptions   HYDROCODONE-ACETAMINOPHEN (NORCO/VICODIN) 5-325 MG PER TABLET    Take 1 tablet by mouth every 6 (six) hours as needed for moderate pain.  Previous Medications   ALLOPURINOL (ZYLOPRIM) 100 MG TABLET    Take 100 mg by mouth 2 (two) times daily.   ARMODAFINIL 250 MG TABLET    Take 250 mg by mouth every morning.   COLCHICINE (COLCRYS)  0.6 MG TABLET    Take 0.6 mg by mouth as needed.   DICLOFENAC (VOLTAREN) 75 MG EC TABLET    Take 1 tablet (75 mg total) by mouth 2 (two) times daily.    DIVALPROEX (DEPAKOTE) 500 MG DR TABLET    Take 500 mg in the morning and 1000 mg at bedtime.   ELETRIPTAN (RELPAX) 40 MG TABLET    Take 40 mg by mouth as needed for migraine or headache. One tablet by mouth at onset of headache. May repeat in 2 hours if headache persists or recurs.   GABAPENTIN (NEURONTIN) 100 MG CAPSULE    1 tab po at bedtime, can increase if needed over a few days to 1 tab three times a day.   HYDROCHLOROTHIAZIDE (HYDRODIURIL) 25 MG TABLET    Take 1 tablet (25 mg total) by mouth daily.   LOSARTAN (COZAAR) 50 MG TABLET    Take 1 tablet (50 mg total) by mouth daily.   VALACYCLOVIR (VALTREX) 1000 MG TABLET    Take 1 tablet (1,000 mg total) by mouth 3 (three) times daily.   VITAMIN C (ASCORBIC ACID) 500 MG TABLET    Take 500 mg by mouth 2 (two) times daily.  Modified Medications   No medications on file  Discontinued Medications   No medications on file

## 2015-01-18 ENCOUNTER — Encounter: Payer: Self-pay | Admitting: Family Medicine

## 2015-01-18 ENCOUNTER — Ambulatory Visit
Admission: RE | Admit: 2015-01-18 | Discharge: 2015-01-18 | Disposition: A | Discharge status: Home / Self Care | Payer: 59 | Source: Ambulatory Visit | Attending: Family Medicine | Admitting: Family Medicine

## 2015-01-18 DIAGNOSIS — R509 Fever, unspecified: Secondary | ICD-10-CM

## 2015-01-18 DIAGNOSIS — M546 Pain in thoracic spine: Secondary | ICD-10-CM

## 2015-01-18 MED ORDER — DIAZEPAM 5 MG PO TABS: ORAL_TABLET | ORAL | Status: DC

## 2015-01-18 NOTE — Telephone Encounter (Signed)
Can you help me with this?  If his MRI is scheduled for Saturday, that certainly makes sense.

## 2015-01-19 LAB — ROCKY MTN SPOTTED FVR ABS PNL(IGG+IGM)
RMSF IGG: 4.33 IV — AB
RMSF IgM: 0.35 IV

## 2015-01-20 ENCOUNTER — Ambulatory Visit
Admission: RE | Admit: 2015-01-20 | Discharge: 2015-01-20 | Disposition: A | Discharge status: Home / Self Care | Payer: 59 | Source: Ambulatory Visit | Attending: Family Medicine | Admitting: Family Medicine

## 2015-01-20 ENCOUNTER — Encounter: Payer: Self-pay | Admitting: Family Medicine

## 2015-01-20 DIAGNOSIS — R509 Fever, unspecified: Secondary | ICD-10-CM | POA: Diagnosis present

## 2015-01-20 DIAGNOSIS — M8448XA Pathological fracture, other site, initial encounter for fracture: Secondary | ICD-10-CM | POA: Insufficient documentation

## 2015-01-20 DIAGNOSIS — X58XXXA Exposure to other specified factors, initial encounter: Secondary | ICD-10-CM | POA: Insufficient documentation

## 2015-01-20 DIAGNOSIS — M546 Pain in thoracic spine: Secondary | ICD-10-CM | POA: Diagnosis present

## 2015-01-20 DIAGNOSIS — M4684 Other specified inflammatory spondylopathies, thoracic region: Secondary | ICD-10-CM | POA: Diagnosis not present

## 2015-01-21 ENCOUNTER — Encounter: Payer: Self-pay | Admitting: Family Medicine

## 2015-01-22 ENCOUNTER — Ambulatory Visit: Payer: Self-pay | Admitting: Family Medicine

## 2015-01-22 ENCOUNTER — Encounter: Payer: Self-pay | Admitting: Family Medicine

## 2015-01-22 LAB — EHRLICHIA ANTIBODY PANEL: E chaffeensis (HGE) Ab, IgM: <1:20 {titer}

## 2015-01-22 NOTE — Telephone Encounter (Signed)
Can you help. Work note return with no restrictions.

## 2015-01-29 ENCOUNTER — Encounter: Payer: Self-pay | Admitting: Family Medicine

## 2015-02-07 ENCOUNTER — Encounter: Payer: Self-pay | Admitting: Family Medicine

## 2015-02-07 MED ORDER — PREDNISONE 20 MG PO TABS: ORAL_TABLET | ORAL | Status: DC

## 2015-02-07 NOTE — Telephone Encounter (Signed)
Please help and call.  Prednisone 20 mg, 2 tabs po for 5 days, then 1 tab po for 5 days. #15, 0 ref  Recommend youtuble: "McKenzie Protocol"  Please spell out for patient.

## 2015-06-05 ENCOUNTER — Ambulatory Visit (INDEPENDENT_AMBULATORY_CARE_PROVIDER_SITE_OTHER): Payer: 59 | Admitting: Family Medicine

## 2015-06-05 ENCOUNTER — Encounter: Payer: Self-pay | Admitting: Family Medicine

## 2015-06-05 VITALS — BP 114/60 | HR 80 | Temp 98.4°F | Ht 68.0 in | Wt 228.5 lb

## 2015-06-05 DIAGNOSIS — J209 Acute bronchitis, unspecified: Secondary | ICD-10-CM | POA: Insufficient documentation

## 2015-06-05 DIAGNOSIS — J029 Acute pharyngitis, unspecified: Secondary | ICD-10-CM | POA: Diagnosis not present

## 2015-06-05 LAB — POCT RAPID STREP A (OFFICE): Rapid Strep A Screen: NEGATIVE

## 2015-06-05 MED ORDER — AZITHROMYCIN 250 MG PO TABS: ORAL_TABLET | ORAL | Status: DC

## 2015-06-05 MED ORDER — GUAIFENESIN-CODEINE 100-10 MG/5ML PO SYRP: 5.0000 mL | ORAL_SOLUTION | Freq: Every evening | ORAL | Status: DC | PRN

## 2015-06-05 NOTE — Assessment & Plan Note (Signed)
Concerning for bacterial super infeciton.. Given new fever on top of 2 weeks of viral symtpoms.  eval for strep.  Treat with antibiotics.

## 2015-06-05 NOTE — Patient Instructions (Addendum)
Complete course of antibiotics. Zpak.  Can cough supressant to use at bedtime.  Stay well hydrated.  Ibuprofen 800 mg OTC ever eight hours for sore throat.  Stay out of work at least 24 hours after starting antibiotics.

## 2015-06-05 NOTE — Progress Notes (Signed)
Pre visit review using our clinic review tool, if applicable. No additional management support is needed unless otherwise documented below in the visit note. 

## 2015-06-05 NOTE — Assessment & Plan Note (Signed)
Treat with antibiotics given new onset fever 2 weeks into course, with worsening symptoms.

## 2015-06-05 NOTE — Progress Notes (Signed)
Subjective:    Patient ID: Roger Brown, male    DOB: [birth year], 34 y.o.   MRN: [id number]  Cough This is a new problem. The current episode started 1 to 4 weeks ago (2 weeks). The problem has been gradually worsening. The problem occurs constantly. The cough is productive of sputum. Associated symptoms include ear congestion, a fever, headaches, nasal congestion and a sore throat. Pertinent negatives include no ear pain, hemoptysis, shortness of breath or wheezing. The symptoms are aggravated by lying down (keeping him up at night). Risk factors: nonsmoker. Treatments tried: coricidin, cough drops. The treatment provided mild relief. There is no history of asthma, COPD or environmental allergies.  Fever  Associated symptoms include coughing, headaches and a sore throat. Pertinent negatives include no ear pain or wheezing.  Sore Throat  This is a new problem. The current episode started yesterday. The maximum temperature recorded prior to his arrival was 103 - 104 F. The fever has been present for 1 to 2 days. The pain is at a severity of 10/10. The pain is severe. Associated symptoms include coughing and headaches. Pertinent negatives include no ear pain or shortness of breath. He has had no exposure to strep or mono. He has tried gargles for the symptoms. The treatment provided no relief.  Headache  Associated symptoms include coughing, a fever and a sore throat. Pertinent negatives include no ear pain.      Review of Systems  Constitutional: Positive for fever.  HENT: Positive for sore throat. Negative for ear pain.   Respiratory: Positive for cough. Negative for hemoptysis, shortness of breath and wheezing.   Allergic/Immunologic: Negative for environmental allergies.  Neurological: Positive for headaches.       Objective:   Physical Exam  Constitutional: Vital signs are normal. He appears well-developed and well-nourished.  Non-toxic appearance. He does not appear ill. No  distress.  HENT:  Head: Normocephalic and atraumatic.  Right Ear: Hearing, external ear and ear canal normal. No tenderness. No foreign bodies. Tympanic membrane is not retracted and not bulging. A middle ear effusion is present.  Left Ear: Hearing, external ear and ear canal normal. No tenderness. No foreign bodies. Tympanic membrane is not retracted and not bulging. A middle ear effusion is present.  Nose: Nose normal. No mucosal edema or rhinorrhea. Right sinus exhibits no maxillary sinus tenderness and no frontal sinus tenderness. Left sinus exhibits no maxillary sinus tenderness and no frontal sinus tenderness.  Mouth/Throat: Uvula is midline and mucous membranes are normal. Normal dentition. No dental caries. Posterior oropharyngeal edema and posterior oropharyngeal erythema present. No oropharyngeal exudate or tonsillar abscesses.  Eyes: Conjunctivae, EOM and lids are normal. Pupils are equal, round, and reactive to light. Lids are everted and swept, no foreign bodies found.  Neck: Trachea normal, normal range of motion and phonation normal. Neck supple. Carotid bruit is not present. No thyroid mass and no thyromegaly present.  Cardiovascular: Normal rate, regular rhythm, S1 normal, S2 normal, normal heart sounds, intact distal pulses and normal pulses.  Exam reveals no gallop.   No murmur heard. Pulmonary/Chest: Effort normal and breath sounds normal. No respiratory distress. He has no wheezes. He has no rhonchi. He has no rales.  Abdominal: Soft. Normal appearance and bowel sounds are normal. There is no hepatosplenomegaly. There is no tenderness. There is no rebound, no guarding and no CVA tenderness. No hernia.  Neurological: He is alert. He has normal reflexes.  Skin: Skin is warm, dry and intact. No  rash noted.  Psychiatric: He has a normal mood and affect. His speech is normal and behavior is normal. Judgment normal.          Assessment & Plan:

## 2015-06-05 NOTE — Addendum Note (Signed)
Addended by: Damita LackLORING, Suleika Donavan S on: 06/05/2015 02:04 PM   Modules accepted: Orders

## 2015-06-08 ENCOUNTER — Encounter: Payer: Self-pay | Admitting: Family Medicine

## 2015-06-08 NOTE — Telephone Encounter (Signed)
Please help and fax note:  The patient has a low grade fever and is currently sick. Please excuse due to illness on 06/09/2015.

## 2015-07-12 DIAGNOSIS — M5441 Lumbago with sciatica, right side: Secondary | ICD-10-CM | POA: Diagnosis not present

## 2015-07-12 DIAGNOSIS — M542 Cervicalgia: Secondary | ICD-10-CM | POA: Diagnosis not present

## 2015-07-12 DIAGNOSIS — G441 Vascular headache, not elsewhere classified: Secondary | ICD-10-CM | POA: Diagnosis not present

## 2015-07-12 DIAGNOSIS — G47419 Narcolepsy without cataplexy: Secondary | ICD-10-CM | POA: Diagnosis not present

## 2015-07-12 DIAGNOSIS — G43009 Migraine without aura, not intractable, without status migrainosus: Secondary | ICD-10-CM | POA: Diagnosis not present

## 2015-07-30 ENCOUNTER — Ambulatory Visit (INDEPENDENT_AMBULATORY_CARE_PROVIDER_SITE_OTHER): Payer: 59 | Admitting: Internal Medicine

## 2015-07-30 ENCOUNTER — Encounter: Payer: Self-pay | Admitting: Internal Medicine

## 2015-07-30 VITALS — BP 140/84 | HR 103 | Temp 99.9°F | Wt 220.0 lb

## 2015-07-30 DIAGNOSIS — R6889 Other general symptoms and signs: Secondary | ICD-10-CM

## 2015-07-30 LAB — POCT RAPID STREP A (OFFICE): RAPID STREP A SCREEN: NEGATIVE

## 2015-07-30 MED ORDER — BENZONATATE 200 MG PO CAPS: 200.0000 mg | ORAL_CAPSULE | Freq: Two times a day (BID) | ORAL | Status: DC | PRN

## 2015-07-30 MED ORDER — ONDANSETRON HCL 4 MG PO TABS: 4.0000 mg | ORAL_TABLET | Freq: Three times a day (TID) | ORAL | Status: DC | PRN

## 2015-07-30 NOTE — Progress Notes (Signed)
HPI  Pt presents to the clinic today with c/o headache, sore throat, fever, chills and body aches. He reports this started last night. He is not having any difficulty swallowing. The  Cough is non productive. He has run fever as high as 103.2. He has tried Dayquil with minimal relief. He has no history of allergies or breathing problems. He has had his flu shot this year. He has not had sick contacts that he is aware of.  Review of Systems      Past Medical History  Diagnosis Date  . Hypertension   . Narcolepsy   . Hydrocele   . Herniated disc, cervical   . Gout 07/01/2013  . Migraine 07/01/2013    Family History  Problem Relation Age of Onset  . Arthritis Mother   . Arthritis Father   . Diabetes Father   . Lupus Father   . Gout Father   . Arthritis Maternal Grandmother   . Diabetes Maternal Grandmother   . Arthritis Maternal Grandfather   . Gout Maternal Grandfather   . Arthritis Paternal Grandmother   . Arthritis Paternal Grandfather   . Gout Brother   . Gout Brother     Social History   Social History  . Marital Status: Married    Spouse Name: N/A  . Number of Children: N/A  . 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 Smoking status: Former Smoker    Types: Cigarettes  . Smokeless tobacco: Never Used  . Alcohol Use: No  . Drug Use: No  . Sexual Activity:    Partners: Female   Other Topics Concern  . Not on file   Social History Narrative   Former patient of Dr. Prince Rome          Allergies  Allergen Reactions  . Amoxicillin Hives and Swelling     Constitutional: Positive headache, fatigue and fever. Denies abrupt weight changes.  HEENT:  Positive sore throat. Denies eye redness, eye pain, pressure behind the eyes, facial pain, nasal congestion, ear pain, ringing in the ears, wax buildup, runny nose or bloody nose. Respiratory: Positive cough. Denies difficulty  breathing or shortness of breath.  Cardiovascular: Denies chest pain, chest tightness, palpitations or swelling in the hands or feet.   No other specific complaints in a complete review of systems (except as listed in HPI above).  Objective:   BP 140/84 mmHg  Pulse 103  Temp(Src) 99.9 F (37.7 C) (Oral)  Wt 220 lb (99.791 kg)  SpO2 99% Wt Readings from Last 3 Encounters:  07/30/15 220 lb (99.791 kg)  06/05/15 228 lb 8 oz (103.647 kg)  01/18/15 215 lb (97.523 kg)     General: Appears his stated age, ill appearing in NAD. HEENT: Head: normal shape and size, maxillary sinus tenderness noted; Eyes: sclera white, no icterus, conjunctiva pink; Ears: Tm's gray and intact, normal light reflex; Nose: mucosa pink and moist, septum midline; Throat/Mouth: Teeth present, mucosa pink and moist, no exudate noted, no lesions or ulcerations noted.  Neck: Cervical lymphadenopathy.  Cardiovascular: Tachycardic with normal rhythm. S1,S2 noted.  No murmur, rubs or gallops noted.  Pulmonary/Chest: Normal effort and positive vesicular breath sounds. No respiratory distress. No wheezes, rales or ronchi noted.      Assessment & Plan:   Flu like illness:  RST: negative Rapid flu: negative Get some rest and drink plenty of water  Alternate Ibuprofen and Tylenol as needed for pain Do salt water gargles for the sore throat eRx for Zofran 4 mg Q8H prn for nausea eRx for Tessalon Pearls Wok note provided  RTC as needed or if symptoms persist.

## 2015-07-30 NOTE — Patient Instructions (Signed)

## 2015-07-30 NOTE — Progress Notes (Signed)
Pre visit review using our clinic review tool, if applicable. No additional management support is needed unless otherwise documented below in the visit note. 

## 2015-07-31 LAB — POCT INFLUENZA A/B
INFLUENZA B, POC: NEGATIVE
Influenza A, POC: NEGATIVE

## 2015-07-31 NOTE — Addendum Note (Signed)
Addended by: Roena Malady on: 07/31/2015 09:43 AM   Modules accepted: Orders

## 2015-08-01 ENCOUNTER — Encounter: Payer: Self-pay | Admitting: Internal Medicine

## 2015-08-02 ENCOUNTER — Ambulatory Visit (INDEPENDENT_AMBULATORY_CARE_PROVIDER_SITE_OTHER): Payer: 59 | Admitting: Internal Medicine

## 2015-08-02 ENCOUNTER — Ambulatory Visit (INDEPENDENT_AMBULATORY_CARE_PROVIDER_SITE_OTHER)
Admission: RE | Admit: 2015-08-02 | Discharge: 2015-08-02 | Disposition: A | Discharge status: Home / Self Care | Payer: 59 | Source: Ambulatory Visit | Attending: Internal Medicine | Admitting: Internal Medicine

## 2015-08-02 ENCOUNTER — Encounter: Payer: Self-pay | Admitting: Internal Medicine

## 2015-08-02 VITALS — BP 140/80 | HR 110 | Temp 98.3°F | Wt 224.0 lb

## 2015-08-02 DIAGNOSIS — R05 Cough: Secondary | ICD-10-CM

## 2015-08-02 DIAGNOSIS — R059 Cough, unspecified: Secondary | ICD-10-CM

## 2015-08-02 DIAGNOSIS — R0602 Shortness of breath: Secondary | ICD-10-CM | POA: Diagnosis not present

## 2015-08-02 LAB — COMPREHENSIVE METABOLIC PANEL
ALBUMIN: 4.5 g/dL (ref 3.5–5.2)
ALT: 48 U/L (ref 0–53)
AST: 31 U/L (ref 0–37)
Alkaline Phosphatase: 74 U/L (ref 39–117)
BUN: 19 mg/dL (ref 6–23)
CHLORIDE: 100 meq/L (ref 96–112)
CO2: 32 mEq/L (ref 19–32)
CREATININE: 1.05 mg/dL (ref 0.40–1.50)
Calcium: 10.1 mg/dL (ref 8.4–10.5)
GFR: 86.2 mL/min (ref >60.00)
GLUCOSE: 102 mg/dL — AB (ref 70–99)
Potassium: 4.2 mEq/L (ref 3.5–5.1)
SODIUM: 141 meq/L (ref 135–145)
Total Bilirubin: 0.3 mg/dL (ref 0.2–1.2)
Total Protein: 8.1 g/dL (ref 6.0–8.3)

## 2015-08-02 LAB — CBC
HCT: 46.1 % (ref 39.0–52.0)
Hemoglobin: 15.5 g/dL (ref 13.0–17.0)
MCHC: 33.6 g/dL (ref 30.0–36.0)
MCV: 86.6 fl (ref 78.0–100.0)
Platelets: 259 10*3/uL (ref 150.0–400.0)
RBC: 5.32 Mil/uL (ref 4.22–5.81)
RDW: 13.4 % (ref 11.5–15.5)
WBC: 6.7 10*3/uL (ref 4.0–10.5)

## 2015-08-02 LAB — MONONUCLEOSIS SCREEN: MONO SCREEN: NEGATIVE

## 2015-08-02 MED ORDER — HYDROCODONE-HOMATROPINE 5-1.5 MG/5ML PO SYRP: 5.0000 mL | ORAL_SOLUTION | Freq: Three times a day (TID) | ORAL | Status: DC | PRN

## 2015-08-02 NOTE — Progress Notes (Signed)
Pre visit review using our clinic review tool, if applicable. No additional management support is needed unless otherwise documented below in the visit note. 

## 2015-08-02 NOTE — Patient Instructions (Signed)

## 2015-08-02 NOTE — Progress Notes (Signed)
HPI  Pt presents to the clinic today to follow up headache, sore throat, fever, chills and body aches. He was seen 2/6 for the same. His flu test was negative. He was treated with fluids, rest, Ibuprofen and Tessalon pearls. He reports he started running fevers again last night, up to 102. He has worsening cough, chest congestion and shortness of breath. In addition to fever, he has had chills and body aches. He reports the Tessalon Pearls and Ibuprofen are not working. He has no history of allergies or breathing problems. He has had his flu shot this year. He has not had sick contacts that he s aware of.  Review of Systems      Past Medical History  Diagnosis Date  . Hypertension   . Narcolepsy   . Hydrocele   . Herniated disc, cervical   . Gout 07/01/2013  . Migraine 07/01/2013    Family History  Problem Relation Age of Onset  . Arthritis Mother   . Arthritis Father   . Diabetes Father   . Lupus Father   . Gout Father   . Arthritis Maternal Grandmother   . Diabetes Maternal Grandmother   . Arthritis Maternal Grandfather   . Gout Maternal Grandfather   . Arthritis Paternal Grandmother   . Arthritis Paternal Grandfather   . Gout Brother   . Gout Brother     Social History   Social History  . Marital Status: Married    Spouse Name: N/A  . Number of Children: N/A  . 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 Smoking status: Former Smoker    Types: Cigarettes  . Smokeless tobacco: Never Used  . Alcohol Use: No  . Drug Use: No  . Sexual Activity:    Partners: Female   Other Topics Concern  . Not on file   Social History Narrative   Former patient of Dr. Prince Rome          Allergies  Allergen Reactions  . Amoxicillin Hives and Swelling     Constitutional: Positive headache, fatigue and fever. Denies abrupt weight changes.  HEENT:  Denies eye redness, eye pain, pressure  behind the eyes, facial pain, nasal congestion, ear pain, ringing in the ears, wax buildup, runny nose or sore throat. Respiratory: Positive cough and shortness of breath. Denies difficulty breathing.  Cardiovascular: Denies chest pain, chest tightness, palpitations or swelling in the hands or feet.   No other specific complaints in a complete review of systems (except as listed in HPI above).  Objective:   Pulse 110  Temp(Src) 98.3 F (36.8 C) (Oral)  Wt 224 lb (101.606 kg)  SpO2 98% Wt Readings from Last 3 Encounters:  08/02/15 224 lb (101.606 kg)  07/30/15 220 lb (99.791 kg)  06/05/15 228 lb 8 oz (103.647 kg)     General: Appears his stated age, ill appearing in NAD. HEENT: Head: normal shape and size, no sinus tenderness noted; Eyes: sclera white, no icterus, conjunctiva pink; Ears: Tm's gray and intact, normal light reflex; Nose: mucosa pink and moist, septum midline; Throat/Mouth: Teeth present, mucosa pink and moist, no exudate noted, no lesions or ulcerations noted.  Neck: Cervical lymphadenopathy, R>L.  Cardiovascular: Tachycardic with normal rhythm. S1,S2 noted.  No murmur, rubs or gallops noted.  Pulmonary/Chest: Normal effort and positive vesicular breath sounds. No respiratory distress. No wheezes, rales or ronchi noted.  Assessment & Plan:   Cough and shortness of breath:  Get some rest and drink plenty of water Alternate Ibuprofen and Tylenol as needed for pain Chest xray to r/o PNA CBC, CMET, mono spot and d dimer today RXfor Hycodan for cough Wok note provided  RTC as needed or if symptoms persist.

## 2015-08-03 ENCOUNTER — Telehealth: Payer: Self-pay | Admitting: Family Medicine

## 2015-08-03 DIAGNOSIS — Z7689 Persons encountering health services in other specified circumstances: Secondary | ICD-10-CM

## 2015-08-03 LAB — D-DIMER, QUANTITATIVE (NOT AT ARMC): D DIMER QUANT: 0.27 ug{FEU}/mL (ref 0.00–0.48)

## 2015-08-03 NOTE — Telephone Encounter (Signed)
Faxed paperwork to [employer]  Copy for pt Copy for billing Copy for scan Copy for file  Pt aware

## 2015-08-03 NOTE — Telephone Encounter (Signed)
[employer] faxed fmla paperwork for pt Spoke to pt he stated he was out of work 07/30/15 - 08/03/15 Pt plans on going back to work 08/06/15  Paperwork in [facility] IN BOX For review and signature

## 2015-08-03 NOTE — Telephone Encounter (Signed)
Done, given back to robin 

## 2015-08-06 NOTE — Telephone Encounter (Signed)
[employer] send paperwork back  Please initial that pt will be out till 08/05/15 and initial 1 page where its checked yes  i put pt would be out of work from 07/30/15 to 08/03/15    Not 07/30/15 to 08/05/15 going back to work on the 13th.   Didn't realize i needed to include the weekend

## 2015-08-06 NOTE — Telephone Encounter (Signed)
Done, given back to Robin 

## 2015-08-06 NOTE — Telephone Encounter (Signed)
In Roger Brown's in box for review and initial

## 2015-08-06 NOTE — Telephone Encounter (Signed)
refaxed paperwork to [employer]

## 2015-11-09 ENCOUNTER — Telehealth: Payer: Self-pay | Admitting: Family Medicine

## 2015-11-09 NOTE — Telephone Encounter (Signed)
[facility]  Last visit that addressed HTN 08/14/2014.  Ok to refill?

## 2015-11-10 NOTE — Telephone Encounter (Signed)
Please call and schedule CPE with Dr. Patsy Lageropland sometime in the next few months.

## 2015-11-10 NOTE — Telephone Encounter (Signed)
Ok to refill #90, 0 refills  Past due for routine CPX, f/u with me in the next few months for cpx

## 2015-11-14 NOTE — Telephone Encounter (Signed)
Labs 7/14 cpx 7/18 Pt aware

## 2015-12-07 ENCOUNTER — Encounter: Payer: Self-pay | Admitting: Physician Assistant

## 2015-12-07 ENCOUNTER — Ambulatory Visit: Payer: Self-pay | Admitting: Physician Assistant

## 2015-12-07 VITALS — BP 140/92 | HR 72 | Temp 98.8°F

## 2015-12-07 DIAGNOSIS — W57XXXA Bitten or stung by nonvenomous insect and other nonvenomous arthropods, initial encounter: Secondary | ICD-10-CM

## 2015-12-07 MED ORDER — DOXYCYCLINE HYCLATE 100 MG PO TABS: 100.0000 mg | ORAL_TABLET | Freq: Two times a day (BID) | ORAL | Status: DC

## 2015-12-07 NOTE — Progress Notes (Signed)
S: c/o bug bite on r upper thigh, noticed a red swollen area, no drainage, states he hasn't felt well since he noticed it, ?bullseye, ?fever, just really tired and fatigued  O: vitals wnl nad, skin with quarter sized red area with ulcerated type center, no drainage or necrosis noted, n/v intact  A: cellulitis secondary to insect bite  P: doxy 100mg  bid x 10 d

## 2016-01-02 ENCOUNTER — Other Ambulatory Visit: Payer: Self-pay | Admitting: Family Medicine

## 2016-01-02 DIAGNOSIS — Z79899 Other long term (current) drug therapy: Secondary | ICD-10-CM

## 2016-01-02 DIAGNOSIS — R5383 Other fatigue: Secondary | ICD-10-CM

## 2016-01-02 DIAGNOSIS — Z13220 Encounter for screening for lipoid disorders: Secondary | ICD-10-CM

## 2016-01-04 ENCOUNTER — Other Ambulatory Visit (INDEPENDENT_AMBULATORY_CARE_PROVIDER_SITE_OTHER): Payer: 59

## 2016-01-04 DIAGNOSIS — Z79899 Other long term (current) drug therapy: Secondary | ICD-10-CM

## 2016-01-04 DIAGNOSIS — Z13220 Encounter for screening for lipoid disorders: Secondary | ICD-10-CM

## 2016-01-04 DIAGNOSIS — R5383 Other fatigue: Secondary | ICD-10-CM

## 2016-01-04 LAB — BASIC METABOLIC PANEL
BUN: 16 mg/dL (ref 6–23)
CALCIUM: 10 mg/dL (ref 8.4–10.5)
CO2: 29 meq/L (ref 19–32)
CREATININE: 0.97 mg/dL (ref 0.40–1.50)
Chloride: 102 mEq/L (ref 96–112)
GFR: 94.21 mL/min (ref >60.00)
Glucose, Bld: 96 mg/dL (ref 70–99)
Potassium: 4.5 mEq/L (ref 3.5–5.1)
Sodium: 140 mEq/L (ref 135–145)

## 2016-01-04 LAB — LIPID PANEL
CHOLESTEROL: 236 mg/dL — AB (ref 0–200)
HDL: 34.3 mg/dL — AB (ref >39.00)
LDL CALC: 165 mg/dL — AB (ref 0–99)
NonHDL: 202.18
TRIGLYCERIDES: 187 mg/dL — AB (ref 0.0–149.0)
Total CHOL/HDL Ratio: 7
VLDL: 37.4 mg/dL (ref 0.0–40.0)

## 2016-01-04 LAB — CBC WITH DIFFERENTIAL/PLATELET
BASOS ABS: 0 10*3/uL (ref 0.0–0.1)
Basophils Relative: 0.4 % (ref 0.0–3.0)
EOS ABS: 0.1 10*3/uL (ref 0.0–0.7)
Eosinophils Relative: 1.4 % (ref 0.0–5.0)
HCT: 43.7 % (ref 39.0–52.0)
HEMOGLOBIN: 14.9 g/dL (ref 13.0–17.0)
Lymphocytes Relative: 43.6 % (ref 12.0–46.0)
Lymphs Abs: 3.1 10*3/uL (ref 0.7–4.0)
MCHC: 34 g/dL (ref 30.0–36.0)
MCV: 85.4 fl (ref 78.0–100.0)
MONO ABS: 0.5 10*3/uL (ref 0.1–1.0)
Monocytes Relative: 6.6 % (ref 3.0–12.0)
Neutro Abs: 3.4 10*3/uL (ref 1.4–7.7)
Neutrophils Relative %: 48 % (ref 43.0–77.0)
Platelets: 241 10*3/uL (ref 150.0–400.0)
RBC: 5.11 Mil/uL (ref 4.22–5.81)
RDW: 13 % (ref 11.5–15.5)
WBC: 7.1 10*3/uL (ref 4.0–10.5)

## 2016-01-04 LAB — TSH: TSH: 1.49 u[IU]/mL (ref 0.35–4.50)

## 2016-01-04 LAB — HEPATIC FUNCTION PANEL
ALBUMIN: 4.5 g/dL (ref 3.5–5.2)
ALT: 55 U/L — AB (ref 0–53)
AST: 51 U/L — AB (ref 0–37)
Alkaline Phosphatase: 77 U/L (ref 39–117)
BILIRUBIN TOTAL: 0.6 mg/dL (ref 0.2–1.2)
Bilirubin, Direct: 0.1 mg/dL (ref 0.0–0.3)
TOTAL PROTEIN: 7.4 g/dL (ref 6.0–8.3)

## 2016-01-08 ENCOUNTER — Encounter: Payer: Self-pay | Admitting: Family Medicine

## 2016-01-08 ENCOUNTER — Ambulatory Visit (INDEPENDENT_AMBULATORY_CARE_PROVIDER_SITE_OTHER): Payer: 59 | Admitting: Family Medicine

## 2016-01-08 VITALS — BP 120/80 | HR 116 | Temp 98.4°F | Ht 68.25 in | Wt 225.2 lb

## 2016-01-08 DIAGNOSIS — R7989 Other specified abnormal findings of blood chemistry: Secondary | ICD-10-CM | POA: Diagnosis not present

## 2016-01-08 DIAGNOSIS — R945 Abnormal results of liver function studies: Secondary | ICD-10-CM

## 2016-01-08 DIAGNOSIS — Z0000 Encounter for general adult medical examination without abnormal findings: Secondary | ICD-10-CM

## 2016-01-08 DIAGNOSIS — E785 Hyperlipidemia, unspecified: Secondary | ICD-10-CM | POA: Diagnosis not present

## 2016-01-08 DIAGNOSIS — Z0001 Encounter for general adult medical examination with abnormal findings: Secondary | ICD-10-CM | POA: Diagnosis not present

## 2016-01-08 HISTORY — DX: Hyperlipidemia, unspecified: E78.5

## 2016-01-08 NOTE — Progress Notes (Signed)
Pre visit review using our clinic review tool, if applicable. No additional management support is needed unless otherwise documented below in the visit note. 

## 2016-01-08 NOTE — Patient Instructions (Signed)
F/u 2 months for repeat blood draw

## 2016-01-08 NOTE — Progress Notes (Signed)
Dr. Frederico Hamman T. Lorrin Bodner, MD, [facility] [place], [address] Phone: [phone] Fax: [phone]  01/08/2016  Patient: Roger Brown, MRN: [id number], DOB: 01-19-1981, 35 y.o.  Primary Physician:  Owens Loffler, MD   Chief Complaint  Patient presents with  . Annual Exam   Subjective:   Roger Brown is a 35 y.o. pleasant patient who presents with the following:  Preventative Health Maintenance Visit:  Health Maintenance Summary Reviewed and updated, unless pt declines services.  Tobacco History Reviewed. Alcohol: No concerns, no excessive use Exercise Habits: Some activity, rec at least 30 mins 5 times a week STD concerns: no risk or activity to increase risk Drug Use: None Encouraged self-testicular check  Boil - on leg, comes back and off and on. A month and a half ago came up, got very large and painful. Did some warm compresses. Was bitten by ? Brown recluse per the people who saw him.   Cholesterol  LFT  Wt Readings from Last 3 Encounters:  01/08/16 225 lb 4 oz (102.173 kg)  08/02/15 224 lb (101.606 kg)  07/30/15 220 lb (99.791 kg)    Lost 5 pounds   Health Maintenance  Topic Date Due  . HIV Screening  02/01/1997  . TETANUS/TDAP  02/01/2001  . INFLUENZA VACCINE  01/22/2016   Immunization History  Administered Date(s) Administered  . Influenza,inj,Quad PF,36+ Mos 03/28/2015   Patient Active Problem List   Diagnosis Date Noted  . Left knee pain 12/26/2014  . Left-sided thoracic back pain 12/22/2014  . Polyarthralgia 12/22/2014  . Gout 07/01/2013  . Migraine 07/01/2013  . Essential hypertension 07/01/2013  . Narcolepsy 07/01/2013  . Obesity (BMI 30-39.9) 06/30/2013   Past Medical History  Diagnosis Date  . Hypertension   . Narcolepsy   . Hydrocele   . Herniated disc, cervical   . Gout 07/01/2013  . Migraine 07/01/2013   Past Surgical History  Procedure Laterality Date  .  Cosmetic surgery    . Tonsillectomy and adenoidectomy     Social History   Social History  . Marital Status: Married    Spouse Name: N/A  . Number of Children: N/A  . 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 Smoking status: Former Smoker    Types: Cigarettes  . Smokeless tobacco: Never Used  . Alcohol Use: No  . Drug Use: No  . Sexual Activity:    Partners: Female   Other Topics Concern  . Not on file   Social History Narrative   Former patient of Dr. Junius Roads         Family History  Problem Relation Age of Onset  . Arthritis Mother   . Arthritis Father   . Diabetes Father   . Lupus Father   . Gout Father   . Arthritis Maternal Grandmother   . Diabetes Maternal Grandmother   . Arthritis Maternal Grandfather   . Gout Maternal Grandfather   . Arthritis Paternal Grandmother   . Arthritis Paternal Grandfather   . Gout Brother   . Gout Brother    Allergies  Allergen Reactions  . Amoxicillin Hives and Swelling    Medication list has been reviewed and updated.   General: Denies fever, chills, sweats. No significant weight loss. Eyes: Denies blurring,significant itching ENT: Denies earache, sore throat, and hoarseness. Cardiovascular: Denies  chest pains, palpitations, dyspnea on exertion Respiratory: Denies cough, dyspnea at rest,wheeezing Breast: no concerns about lumps GI: Denies nausea, vomiting, diarrhea, constipation, change in bowel habits, abdominal pain, melena, hematochezia GU: Denies penile discharge, ED, urinary flow / outflow problems. No STD concerns. Musculoskeletal: ONGOING LOW BACK PAIN Derm: painful cyst-like area R inner thigh Neuro: Denies  paresthesias, frequent falls, frequent headaches Psych: Denies depression, anxiety Endocrine: Denies cold intolerance, heat intolerance, polydipsia Heme: Denies enlarged lymph nodes Allergy: No  hayfever  Objective:   BP 120/80 mmHg  Pulse 116  Temp(Src) 98.4 F (36.9 C) (Oral)  Ht 5' 8.25" (1.734 m)  Wt 225 lb 4 oz (102.173 kg)  BMI 33.98 kg/m2 Ideal Body Weight: Weight in (lb) to have BMI = 25: 165.3  No exam data present  GEN: well developed, well nourished, no acute distress Eyes: conjunctiva and lids normal, PERRLA, EOMI ENT: TM clear, nares clear, oral exam WNL Neck: supple, no lymphadenopathy, no thyromegaly, no JVD Pulm: clear to auscultation and percussion, respiratory effort normal CV: regular rate and rhythm, S1-S2, no murmur, rub or gallop, no bruits, peripheral pulses normal and symmetric, no cyanosis, clubbing, edema or varicosities GI: soft, non-tender; no hepatosplenomegaly, masses; active bowel sounds all quadrants GU: no hernia, testicular mass, penile discharge Lymph: no cervical, axillary or inguinal adenopathy MSK: gait normal, muscle tone and strength WNL, no joint swelling, effusions, discoloration, crepitus  SKIN: CYST VS LIPOMA R INNER THIGH Neuro: normal mental status, normal strength, sensation, and motion Psych: alert; oriented to person, place and time, normally interactive and not anxious or depressed in appearance. All labs reviewed with patient.  Lipids:    Component Value Date/Time   CHOL 236* 01/04/2016 0816   TRIG 187.0* 01/04/2016 0816   HDL 34.30* 01/04/2016 0816   VLDL 37.4 01/04/2016 0816   CHOLHDL 7 01/04/2016 0816   CBC: CBC Latest Ref Rng 01/04/2016 08/02/2015 12/26/2014  WBC 4.0 - 10.5 K/uL 7.1 6.7 6.2  Hemoglobin 13.0 - 17.0 g/dL 14.9 15.5 13.7  Hematocrit 39.0 - 52.0 % 43.7 46.1 40.6  Platelets 150.0 - 400.0 K/uL 241.0 259.0 287.8    Basic Metabolic Panel:    Component Value Date/Time   NA 140 01/04/2016 0816   K 4.5 01/04/2016 0816   CL 102 01/04/2016 0816   CO2 29 01/04/2016 0816   BUN 16 01/04/2016 0816   CREATININE 0.97 01/04/2016 0816   GLUCOSE 96 01/04/2016 0816   CALCIUM 10.0 01/04/2016 0816   Hepatic  Function Latest Ref Rng 01/04/2016 08/02/2015  Total Protein 6.0 - 8.3 g/dL 7.4 8.1  Albumin 3.5 - 5.2 g/dL 4.5 4.5  AST 0 - 37 U/L 51(H) 31  ALT 0 - 53 U/L 55(H) 48  Alk Phosphatase 39 - 117 U/L 77 74  Total Bilirubin 0.2 - 1.2 mg/dL 0.6 0.3  Bilirubin, Direct 0.0 - 0.3 mg/dL 0.1 -    Lab Results  Component Value Date   TSH 1.49 01/04/2016   No results found for: PSA  Assessment and Plan:   Healthcare maintenance  Elevated LFTs - Plan: US Abdomen Limited RUQ  Check [place] - eval for fatty liver Cannot exclude depakote Recheck 2 mo  Health Maintenance Exam: The patient's preventative maintenance and recommended screening tests for an annual wellness exam were reviewed in full today. Brought up to date unless services declined.  Counselled on the importance of diet, exercise, and its role in overall health and mortality. The patient's FH and SH was reviewed, including their home  life, tobacco status, and drug and alcohol status.  Follow-up: No Follow-up on file. Unless noted, follow-up in 1 year for Health Maintenance Exam.  Orders Placed This Encounter  Procedures  . US Abdomen Limited RUQ    Signed,  Manreet Kiernan T. Sanjith Siwek, MD   Patient's Medications  New Prescriptions   No medications on file  Previous Medications   ALPRAZOLAM (XANAX) 0.5 MG TABLET    Take 0.5 mg by mouth as needed for anxiety.   ARMODAFINIL 250 MG TABLET    Take 250 mg by mouth every morning.   COLCHICINE (COLCRYS) 0.6 MG TABLET    Take 0.6 mg by mouth as needed.   DIVALPROEX (DEPAKOTE) 500 MG DR TABLET    Take 500 mg in the morning and 1000 mg at bedtime.   ELETRIPTAN (RELPAX) 40 MG TABLET    Take 40 mg by mouth as needed for migraine or headache. One tablet by mouth at onset of headache. May repeat in 2 hours if headache persists or recurs.   HYDROCHLOROTHIAZIDE (HYDRODIURIL) 25 MG TABLET    TAKE 1 TABLET BY MOUTH DAILY   LOSARTAN (COZAAR) 50 MG TABLET    TAKE 1 TABLET BY MOUTH DAILY    METHYLPHENIDATE (RITALIN) 10 MG TABLET    Take 10 mg by mouth 2 (two) times daily.   VITAMIN C (ASCORBIC ACID) 500 MG TABLET    Take 500 mg by mouth 2 (two) times daily. Reported on 12/07/2015  Modified Medications   No medications on file  Discontinued Medications   ALLOPURINOL (ZYLOPRIM) 100 MG TABLET    Take 100 mg by mouth 2 (two) times daily. Reported on 12/07/2015   DOXYCYCLINE (VIBRA-TABS) 100 MG TABLET    Take 1 tablet (100 mg total) by mouth 2 (two) times daily.

## 2016-01-11 ENCOUNTER — Ambulatory Visit
Admission: RE | Admit: 2016-01-11 | Discharge: 2016-01-11 | Disposition: A | Discharge status: Home / Self Care | Payer: 59 | Source: Ambulatory Visit | Attending: Family Medicine | Admitting: Family Medicine

## 2016-01-11 DIAGNOSIS — R7989 Other specified abnormal findings of blood chemistry: Secondary | ICD-10-CM | POA: Insufficient documentation

## 2016-01-11 DIAGNOSIS — R945 Abnormal results of liver function studies: Secondary | ICD-10-CM

## 2016-01-11 DIAGNOSIS — R932 Abnormal findings on diagnostic imaging of liver and biliary tract: Secondary | ICD-10-CM | POA: Insufficient documentation

## 2016-02-06 ENCOUNTER — Other Ambulatory Visit: Payer: Self-pay | Admitting: Family Medicine

## 2016-02-18 ENCOUNTER — Encounter: Payer: Self-pay | Admitting: Primary Care

## 2016-02-18 ENCOUNTER — Ambulatory Visit (INDEPENDENT_AMBULATORY_CARE_PROVIDER_SITE_OTHER)
Admission: RE | Admit: 2016-02-18 | Discharge: 2016-02-18 | Disposition: A | Discharge status: Home / Self Care | Payer: 59 | Source: Ambulatory Visit | Attending: Primary Care | Admitting: Primary Care

## 2016-02-18 ENCOUNTER — Ambulatory Visit (INDEPENDENT_AMBULATORY_CARE_PROVIDER_SITE_OTHER): Payer: 59 | Admitting: Primary Care

## 2016-02-18 VITALS — BP 146/104 | HR 96 | Temp 99.1°F | Ht 68.25 in | Wt 224.1 lb

## 2016-02-18 DIAGNOSIS — R062 Wheezing: Secondary | ICD-10-CM | POA: Diagnosis not present

## 2016-02-18 DIAGNOSIS — R05 Cough: Secondary | ICD-10-CM

## 2016-02-18 DIAGNOSIS — R059 Cough, unspecified: Secondary | ICD-10-CM

## 2016-02-18 DIAGNOSIS — R0602 Shortness of breath: Secondary | ICD-10-CM | POA: Diagnosis not present

## 2016-02-18 LAB — POC INFLUENZA A&B (BINAX/QUICKVUE)
Influenza A, POC: NEGATIVE
Influenza B, POC: NEGATIVE

## 2016-02-18 MED ORDER — ALBUTEROL SULFATE HFA 108 (90 BASE) MCG/ACT IN AERS: 2.0000 | INHALATION_SPRAY | Freq: Four times a day (QID) | RESPIRATORY_TRACT | 0 refills | Status: AC | PRN

## 2016-02-18 MED ORDER — PREDNISONE 10 MG PO TABS: ORAL_TABLET | ORAL | 0 refills | Status: DC

## 2016-02-18 MED ORDER — ALBUTEROL SULFATE (2.5 MG/3ML) 0.083% IN NEBU
2.5000 mg | INHALATION_SOLUTION | Freq: Once | RESPIRATORY_TRACT | Status: AC
  Administered 2016-02-18: 2.5 mg via RESPIRATORY_TRACT

## 2016-02-18 MED ORDER — IPRATROPIUM BROMIDE 0.02 % IN SOLN
0.5000 mg | Freq: Once | RESPIRATORY_TRACT | Status: AC
  Administered 2016-02-18: 0.5 mg via RESPIRATORY_TRACT

## 2016-02-18 MED ORDER — HYDROCODONE-HOMATROPINE 5-1.5 MG/5ML PO SYRP: 5.0000 mL | ORAL_SOLUTION | Freq: Three times a day (TID) | ORAL | 0 refills | Status: DC | PRN

## 2016-02-18 NOTE — Patient Instructions (Addendum)
You were provided with a breathing treatment of albuterol and ipratropium for wheezing.  You may use the albuterol inhaler as needed. Inhale 2 puffs into the lungs every 6 to 8 hours as needed for wheezing and/or shortness of breath.   Start prednisone tablets. Take three tablets for 2 days, then two tablets for 2 days, then one tablet for 2 days.  Start Tylenol or ibuprofen as needed for fevers and body aches. Take this as directed until fevers are gone.  Your flu test was negative.  You may take the Hycodan cough suppressant every 8 hours as needed for cough and rest. Caution this medication contains codeine and will make you feel drowsy.  Complete xray(s) prior to leaving today. I will notify you of your results once received.  It was a pleasure meeting you today!

## 2016-02-18 NOTE — Progress Notes (Signed)
Pre visit review using our clinic review tool, if applicable. No additional management support is needed unless otherwise documented below in the visit note. 

## 2016-02-18 NOTE — Progress Notes (Signed)
Subjective:    Patient ID: Roger Brown, male    DOB: 1980-12-08, 36 y.o.   MRN: [id number]  HPI  Roger Brown is a 35 year old male who presents today with a chief complaint of cough. He also reports fevers, shortness of breath, wheezing, chills, body aches. His fevers are running 101 to 104 on average, 99.1 in the clinic today. His symptoms began 2 days ago. His cough is mostly non productive. He's taken Nyquil and ibuprofen with some reduction in fevers. His daughter has had a minor cough within the last several days.   Review of Systems  Constitutional: Positive for chills, fatigue and fever.  HENT: Positive for congestion. Negative for sinus pressure and sore throat.   Respiratory: Positive for cough, shortness of breath and wheezing.   Cardiovascular: Negative for chest pain.       Past Medical History:  Diagnosis Date  . Gout 07/01/2013  . Herniated disc, cervical   . Hydrocele   . Hyperlipidemia 01/08/2016  . Hypertension   . Migraine 07/01/2013  . Narcolepsy      Social History   Social History  . Marital status: Married    Spouse name: N/A  . Number of children: N/A  . Years of education: N/A   Occupational History  . 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 Smoking status: Former Smoker    Types: Cigarettes  . Smokeless tobacco: Never Used  . Alcohol use No  . Drug use: No  . Sexual activity: Yes    Partners: Female   Other Topics Concern  . Not on file   Social History Narrative   Former patient of Dr. Prince Rome          Past Surgical History:  Procedure Laterality Date  . COSMETIC SURGERY    . TONSILLECTOMY AND ADENOIDECTOMY      Family History  Problem Relation Age of Onset  . Arthritis Mother   . Arthritis Father   . Diabetes Father   . Lupus Father   . Gout Father   . Arthritis Maternal Grandmother   . Diabetes Maternal Grandmother   . Arthritis Maternal Grandfather   . Gout Maternal Grandfather    . Arthritis Paternal Grandmother   . Arthritis Paternal Grandfather   . Gout Brother   . Gout Brother     Allergies  Allergen Reactions  . Amoxicillin Hives and Swelling    Current Outpatient Prescriptions on File Prior to Visit  Medication Sig Dispense Refill  . ALPRAZolam (XANAX) 0.5 MG tablet Take 0.5 mg by mouth as needed for anxiety.    . Armodafinil 250 MG tablet Take 250 mg by mouth every morning.    . colchicine (COLCRYS) 0.6 MG tablet Take 0.6 mg by mouth as needed.    . divalproex (DEPAKOTE) 500 MG DR tablet Take 500 mg in the morning and 1000 mg at bedtime.    Marland Kitchen eletriptan (RELPAX) 40 MG tablet Take 40 mg by mouth as needed for migraine or headache. One tablet by mouth at onset of headache. May repeat in 2 hours if headache persists or recurs.    . hydrochlorothiazide (HYDRODIURIL) 25 MG tablet TAKE 1 TABLET BY MOUTH DAILY 90 tablet 0  . losartan (COZAAR) 50 MG tablet TAKE 1 TABLET BY MOUTH DAILY 90 tablet 3  . methylphenidate (RITALIN) 10 MG tablet Take 10 mg by mouth 2 (two) times daily.    Marland Kitchen  vitamin C (ASCORBIC ACID) 500 MG tablet Take 500 mg by mouth 2 (two) times daily. Reported on 12/07/2015     No current facility-administered medications on file prior to visit.     BP (!) 146/104   Pulse 96   Temp 99.1 F (37.3 C) (Oral)   Ht 5' 8.25" (1.734 m)   Wt 224 lb 1.9 oz (101.7 kg)   SpO2 97%   BMI 33.83 kg/m    Objective:   Physical Exam  Constitutional: He appears ill.  HENT:  Right Ear: Tympanic membrane and ear canal normal.  Left Ear: Tympanic membrane and ear canal normal.  Nose: Mucosal edema present. Right sinus exhibits no maxillary sinus tenderness and no frontal sinus tenderness. Left sinus exhibits no maxillary sinus tenderness and no frontal sinus tenderness.  Mouth/Throat: Oropharynx is clear and moist. Oropharyngeal exudate: .basic.  Neck: Neck supple.  Pulmonary/Chest: Effort normal. No respiratory distress. He has no decreased breath sounds.  He has wheezes. He has rhonchi in the right upper field, the right lower field, the left upper field and the left lower field.  Skin: Skin is warm.  Mildly diaphoretic          Assessment & Plan:  Viral bronchitis:  Cough, wheezing, shortness of breath, fatigue, fevers 2 days. Fevers running 101-104, low-grade fever in clinic today. Exam today with inspiratory and expiratory wheezing. DuoNeb provided in office today with slight improvement in wheezing posttreatment. Does appear acutely ill. Rapid flu negative. Chest x-ray today without evidence of pneumonia. Mild hypoinflation.  Suspect viral process at this time and will treat symptoms.  Prednisone taper, albuterol inhaler, cough syrup sent to pharmacy for treatment.  Discussed use of Tylenol or ibuprofen routinely as needed for fevers and body aches.  Work note provided. Strict return precautions provided.   Morrie Sheldonlark,Katherine Kendal, NP

## 2016-02-20 ENCOUNTER — Encounter: Payer: Self-pay | Admitting: Primary Care

## 2016-02-20 ENCOUNTER — Other Ambulatory Visit: Payer: Self-pay | Admitting: Primary Care

## 2016-02-20 DIAGNOSIS — R05 Cough: Secondary | ICD-10-CM

## 2016-02-20 DIAGNOSIS — R059 Cough, unspecified: Secondary | ICD-10-CM

## 2016-02-20 MED ORDER — AZITHROMYCIN 250 MG PO TABS: ORAL_TABLET | ORAL | 0 refills | Status: DC

## 2016-02-21 NOTE — Telephone Encounter (Signed)
Pt left v/m; cough is slightly better; but seems worse at night; pt developed h/a on lt side of head; pt takes med for h/a and h/a comes back. Pt is not sure if h/a is coming from coughing or what is causing h/a. Pt request cb [phone].

## 2016-02-22 ENCOUNTER — Other Ambulatory Visit: Payer: Self-pay | Admitting: Internal Medicine

## 2016-02-22 MED ORDER — HYDROCOD POLST-CPM POLST ER 10-8 MG/5ML PO SUER: 5.0000 mL | Freq: Every evening | ORAL | 0 refills | Status: DC | PRN

## 2016-02-22 NOTE — Telephone Encounter (Signed)
Pt left v/m requesting cb at [phone] when rx ready for pick up.

## 2016-03-03 DIAGNOSIS — M1009 Idiopathic gout, multiple sites: Secondary | ICD-10-CM | POA: Diagnosis not present

## 2016-03-20 DIAGNOSIS — G43009 Migraine without aura, not intractable, without status migrainosus: Secondary | ICD-10-CM | POA: Diagnosis not present

## 2016-03-20 DIAGNOSIS — F909 Attention-deficit hyperactivity disorder, unspecified type: Secondary | ICD-10-CM | POA: Diagnosis not present

## 2016-03-20 DIAGNOSIS — G47419 Narcolepsy without cataplexy: Secondary | ICD-10-CM | POA: Diagnosis not present

## 2016-03-20 DIAGNOSIS — M542 Cervicalgia: Secondary | ICD-10-CM | POA: Diagnosis not present

## 2016-03-20 DIAGNOSIS — M5481 Occipital neuralgia: Secondary | ICD-10-CM | POA: Diagnosis not present

## 2016-04-28 ENCOUNTER — Ambulatory Visit (INDEPENDENT_AMBULATORY_CARE_PROVIDER_SITE_OTHER): Payer: Self-pay | Admitting: Sports Medicine

## 2016-05-01 DIAGNOSIS — G43009 Migraine without aura, not intractable, without status migrainosus: Secondary | ICD-10-CM | POA: Diagnosis not present

## 2016-05-01 DIAGNOSIS — M542 Cervicalgia: Secondary | ICD-10-CM | POA: Diagnosis not present

## 2016-05-01 DIAGNOSIS — G441 Vascular headache, not elsewhere classified: Secondary | ICD-10-CM | POA: Diagnosis not present

## 2016-05-09 ENCOUNTER — Other Ambulatory Visit: Payer: Self-pay | Admitting: Family Medicine

## 2016-05-29 ENCOUNTER — Ambulatory Visit (INDEPENDENT_AMBULATORY_CARE_PROVIDER_SITE_OTHER): Payer: Self-pay | Admitting: Sports Medicine

## 2016-06-02 ENCOUNTER — Ambulatory Visit (INDEPENDENT_AMBULATORY_CARE_PROVIDER_SITE_OTHER): Payer: Self-pay | Admitting: Sports Medicine

## 2016-06-12 DIAGNOSIS — G43009 Migraine without aura, not intractable, without status migrainosus: Secondary | ICD-10-CM | POA: Diagnosis not present

## 2016-06-12 DIAGNOSIS — G47419 Narcolepsy without cataplexy: Secondary | ICD-10-CM | POA: Diagnosis not present

## 2016-06-12 DIAGNOSIS — M542 Cervicalgia: Secondary | ICD-10-CM | POA: Diagnosis not present

## 2016-06-12 DIAGNOSIS — M5417 Radiculopathy, lumbosacral region: Secondary | ICD-10-CM | POA: Diagnosis not present

## 2016-06-12 DIAGNOSIS — M5481 Occipital neuralgia: Secondary | ICD-10-CM | POA: Diagnosis not present

## 2016-06-24 ENCOUNTER — Emergency Department
Admission: EM | Admit: 2016-06-24 | Discharge: 2016-06-24 | Disposition: A | Discharge status: Home / Self Care | Payer: 59 | Attending: Emergency Medicine | Admitting: Emergency Medicine

## 2016-06-24 ENCOUNTER — Telehealth: Payer: Self-pay | Admitting: Family Medicine

## 2016-06-24 ENCOUNTER — Encounter: Payer: Self-pay | Admitting: Emergency Medicine

## 2016-06-24 ENCOUNTER — Emergency Department: Payer: 59

## 2016-06-24 DIAGNOSIS — Z79899 Other long term (current) drug therapy: Secondary | ICD-10-CM | POA: Diagnosis not present

## 2016-06-24 DIAGNOSIS — K529 Noninfective gastroenteritis and colitis, unspecified: Secondary | ICD-10-CM | POA: Diagnosis not present

## 2016-06-24 DIAGNOSIS — I10 Essential (primary) hypertension: Secondary | ICD-10-CM | POA: Insufficient documentation

## 2016-06-24 DIAGNOSIS — R1013 Epigastric pain: Secondary | ICD-10-CM

## 2016-06-24 DIAGNOSIS — R112 Nausea with vomiting, unspecified: Secondary | ICD-10-CM

## 2016-06-24 DIAGNOSIS — R197 Diarrhea, unspecified: Secondary | ICD-10-CM

## 2016-06-24 DIAGNOSIS — Z87891 Personal history of nicotine dependence: Secondary | ICD-10-CM | POA: Diagnosis not present

## 2016-06-24 LAB — URINALYSIS, COMPLETE (UACMP) WITH MICROSCOPIC
BACTERIA UA: NONE SEEN
BILIRUBIN URINE: NEGATIVE
Glucose, UA: NEGATIVE mg/dL
HGB URINE DIPSTICK: NEGATIVE
KETONES UR: NEGATIVE mg/dL
LEUKOCYTES UA: NEGATIVE
NITRITE: NEGATIVE
PH: 8 (ref 5.0–8.0)
Protein, ur: NEGATIVE mg/dL
RBC / HPF: NONE SEEN RBC/hpf (ref 0–5)
Specific Gravity, Urine: 1.023 (ref 1.005–1.030)
Squamous Epithelial / LPF: NONE SEEN

## 2016-06-24 LAB — COMPREHENSIVE METABOLIC PANEL
ALT: 30 U/L (ref 17–63)
ANION GAP: 10 (ref 5–15)
AST: 26 U/L (ref 15–41)
Albumin: 4.7 g/dL (ref 3.5–5.0)
Alkaline Phosphatase: 59 U/L (ref 38–126)
BUN: 20 mg/dL (ref 6–20)
CHLORIDE: 101 mmol/L (ref 101–111)
CO2: 28 mmol/L (ref 22–32)
Calcium: 9.7 mg/dL (ref 8.9–10.3)
Creatinine, Ser: 1.03 mg/dL (ref 0.61–1.24)
Glucose, Bld: 113 mg/dL — ABNORMAL HIGH (ref 65–99)
POTASSIUM: 4.2 mmol/L (ref 3.5–5.1)
Sodium: 139 mmol/L (ref 135–145)
TOTAL PROTEIN: 8.3 g/dL — AB (ref 6.5–8.1)
Total Bilirubin: 0.9 mg/dL (ref 0.3–1.2)

## 2016-06-24 LAB — CBC
HEMATOCRIT: 50.4 % (ref 40.0–52.0)
HEMOGLOBIN: 17.1 g/dL (ref 13.0–18.0)
MCH: 29.8 pg (ref 26.0–34.0)
MCHC: 34.1 g/dL (ref 32.0–36.0)
MCV: 87.5 fL (ref 80.0–100.0)
Platelets: 295 10*3/uL (ref 150–440)
RBC: 5.76 MIL/uL (ref 4.40–5.90)
RDW: 13.7 % (ref 11.5–14.5)
WBC: 17.4 10*3/uL — AB (ref 3.8–10.6)

## 2016-06-24 LAB — LACTIC ACID, PLASMA
LACTIC ACID, VENOUS: 2.3 mmol/L — AB (ref 0.5–1.9)
Lactic Acid, Venous: 2.7 mmol/L (ref 0.5–1.9)
Lactic Acid, Venous: 1.4 mmol/L (ref 0.5–1.9)

## 2016-06-24 LAB — LIPASE, BLOOD: LIPASE: 17 U/L (ref 11–51)

## 2016-06-24 MED ORDER — ONDANSETRON 4 MG PO TBDP
4.0000 mg | ORAL_TABLET | Freq: Once | ORAL | Status: AC
  Administered 2016-06-24: 4 mg via ORAL

## 2016-06-24 MED ORDER — METOCLOPRAMIDE HCL 5 MG/ML IJ SOLN
INTRAMUSCULAR | Status: AC
  Administered 2016-06-24: 10 mg via INTRAVENOUS
  Filled 2016-06-24: qty 2

## 2016-06-24 MED ORDER — KETOROLAC TROMETHAMINE 30 MG/ML IJ SOLN
30.0000 mg | Freq: Once | INTRAMUSCULAR | Status: AC
  Administered 2016-06-24: 30 mg via INTRAVENOUS

## 2016-06-24 MED ORDER — KETOROLAC TROMETHAMINE 30 MG/ML IJ SOLN
INTRAMUSCULAR | Status: AC
  Administered 2016-06-24: 30 mg via INTRAVENOUS
  Filled 2016-06-24: qty 1

## 2016-06-24 MED ORDER — ONDANSETRON 4 MG PO TBDP
ORAL_TABLET | ORAL | Status: AC
  Administered 2016-06-24: 4 mg via ORAL
  Filled 2016-06-24: qty 1

## 2016-06-24 MED ORDER — SODIUM CHLORIDE 0.9 % IV BOLUS (SEPSIS)
1000.0000 mL | Freq: Once | INTRAVENOUS | Status: AC
  Administered 2016-06-24: 1000 mL via INTRAVENOUS

## 2016-06-24 MED ORDER — ONDANSETRON HCL 4 MG PO TABS: 4.0000 mg | ORAL_TABLET | Freq: Three times a day (TID) | ORAL | 0 refills | Status: DC | PRN

## 2016-06-24 MED ORDER — ONDANSETRON HCL 4 MG/2ML IJ SOLN
4.0000 mg | Freq: Once | INTRAMUSCULAR | Status: AC
  Administered 2016-06-24: 4 mg via INTRAVENOUS
  Filled 2016-06-24: qty 2

## 2016-06-24 MED ORDER — METOCLOPRAMIDE HCL 5 MG/ML IJ SOLN
10.0000 mg | Freq: Once | INTRAMUSCULAR | Status: AC
  Administered 2016-06-24: 10 mg via INTRAVENOUS

## 2016-06-24 MED ORDER — SODIUM CHLORIDE 0.9 % IV BOLUS (SEPSIS)
1000.0000 mL | Freq: Once | INTRAVENOUS | Status: AC
Start: 2016-06-24 — End: 2016-06-24
  Administered 2016-06-24: 1000 mL via INTRAVENOUS

## 2016-06-24 MED ORDER — IOPAMIDOL (ISOVUE-370) INJECTION 76%
125.0000 mL | Freq: Once | INTRAVENOUS | Status: AC | PRN
Start: 2016-06-24 — End: 2016-06-24
  Administered 2016-06-24: 125 mL via INTRAVENOUS

## 2016-06-24 MED ORDER — MORPHINE SULFATE (PF) 4 MG/ML IV SOLN
4.0000 mg | Freq: Once | INTRAVENOUS | Status: AC
  Administered 2016-06-24: 4 mg via INTRAVENOUS
  Filled 2016-06-24: qty 1

## 2016-06-24 NOTE — Telephone Encounter (Signed)
Patient Name: Roger GravelJOSHUA Brown  DOB: 02/02/1981    Initial Comment Caller is having severe stabbing abdominal pain, vomiting, and diarrhea.    Nurse Assessment  Nurse: Stefano GaulStringer, RN, Dwana CurdVera Date/Time (Eastern Time): 06/24/2016 11:22:59 AM  Confirm and document reason for call. If symptomatic, describe symptoms. ---Caller states he has been vomiting and has had diarrhea since 3 am. Has vomited about 7 times. He is chilling. Abd pain started about 3 am. pain is above the navel. Pain is severe.  Does the patient have any new or worsening symptoms? ---Yes  Will a triage be completed? ---Yes  Related visit to physician within the last 2 weeks? ---No  Does the PT have any chronic conditions? (i.e. diabetes, asthma, etc.) ---Yes  List chronic conditions. ---HTN  Is this a behavioral health or substance abuse call? ---No     Guidelines    Guideline Title Affirmed Question Affirmed Notes  Abdominal Pain - Upper [1] SEVERE pain (e.g., excruciating) AND [2] present > 1 hour    Final Disposition User   Go to ED Now Stefano GaulStringer, RN, Vera    Referrals  [facility] - ED   Disagree/Comply: Comply

## 2016-06-24 NOTE — ED Notes (Signed)
Patient transported to CT 

## 2016-06-24 NOTE — ED Provider Notes (Signed)
[facility] Emergency Department Provider Note  ____________________________________________  Time seen: Approximately 1:32 PM  I have reviewed the triage vital signs and the nursing notes.   HISTORY  Chief Complaint Abdominal Pain; Emesis; and Diarrhea   HPI Roger Brown is a 36 y.o. male the history of hypertension, hyperlipidemia, and narcolepsy who presents for evaluation of epigastric abdominal pain. Patient reports the pain woke him up from his sleep at 3:30 AM initially severe currently 6/10, sharp stabbing in quality, located in the epigastric region, nonradiating, associated with more than 10 episodes of nonbloody nonbilious emesis and watery diarrhea. Patient denies ever having similar pain in the past. He is not a smoker. His daughter has had a GI bug recently with vomiting. Patient has tried Zofran at home unsuccessfully. He denies fever or chills. He denies recent NSAID use, he does not drink, no history of peptic ulcer disease. Patient is also feeling dizzy. No chest pain or shortness of breath. No fever or chills, no dysuria  Past Medical History:  Diagnosis Date  . Gout 07/01/2013  . Herniated disc, cervical   . Hydrocele   . Hyperlipidemia 01/08/2016  . Hypertension   . Migraine 07/01/2013  . Narcolepsy     Patient Active Problem List   Diagnosis Date Noted  . Hyperlipidemia 01/08/2016  . Left knee pain 12/26/2014  . Left-sided thoracic back pain 12/22/2014  . Polyarthralgia 12/22/2014  . Gout 07/01/2013  . Migraine 07/01/2013  . Essential hypertension 07/01/2013  . Narcolepsy 07/01/2013  . Obesity (BMI 30-39.9) 06/30/2013    Past Surgical History:  Procedure Laterality Date  . COSMETIC SURGERY    . TONSILLECTOMY AND ADENOIDECTOMY      Prior to Admission medications   Medication Sig Start Date End Date Taking? Authorizing Provider  albuterol (PROVENTIL HFA;VENTOLIN HFA) 108 (90 Base) MCG/ACT inhaler Inhale 2 puffs into the  lungs every 6 (six) hours as needed for wheezing or shortness of breath. 02/18/16   Doreene NestKatherine K Clark, NP  ALPRAZolam Prudy Feeler(XANAX) 0.5 MG tablet Take 0.5 mg by mouth as needed for anxiety.    Historical Provider, MD  Armodafinil 250 MG tablet Take 250 mg by mouth every morning.    Historical Provider, MD  azithromycin (ZITHROMAX) 250 MG tablet Take 2 tablets by mouth today, then 1 tablet daily for 4 additional days. 02/20/16   Doreene NestKatherine K Clark, NP  chlorpheniramine-HYDROcodone [facility](TUSSIONEX PENNKINETIC ER) 10-8 MG/5ML SUER Take 5 mLs by mouth at bedtime as needed. 02/22/16   Lorre Munroeegina W Baity, NP  colchicine (COLCRYS) 0.6 MG tablet Take 0.6 mg by mouth as needed.    Historical Provider, MD  divalproex (DEPAKOTE) 500 MG DR tablet Take 500 mg in the morning and 1000 mg at bedtime.    Historical Provider, MD  eletriptan (RELPAX) 40 MG tablet Take 40 mg by mouth as needed for migraine or headache. One tablet by mouth at onset of headache. May repeat in 2 hours if headache persists or recurs.    Historical Provider, MD  hydrochlorothiazide (HYDRODIURIL) 25 MG tablet TAKE 1 TABLET BY MOUTH DAILY 05/09/16   Hannah BeatSpencer Copland, MD  HYDROcodone-homatropine (HYCODAN) 5-1.5 MG/5ML syrup Take 5 mLs by mouth every 8 (eight) hours as needed for cough. 02/18/16   Doreene NestKatherine K Clark, NP  losartan (COZAAR) 50 MG tablet TAKE 1 TABLET BY MOUTH DAILY 02/06/16   Hannah BeatSpencer Copland, MD  methylphenidate (RITALIN) 10 MG tablet Take 10 mg by mouth 2 (two) times daily.    Historical  Provider, MD  ondansetron (ZOFRAN) 4 MG tablet Take 1 tablet (4 mg total) by mouth every 8 (eight) hours as needed for nausea or vomiting. 06/24/16   Nita Sickle, MD  predniSONE (DELTASONE) 10 MG tablet Take three tablets for 2 days, then two tablets for 2 days, then one tablet for 2 days. 02/18/16   Doreene Nest, NP  vitamin C (ASCORBIC ACID) 500 MG tablet Take 500 mg by mouth 2 (two) times daily. Reported on 12/07/2015    Historical Provider, MD     Allergies Amoxicillin  Family History  Problem Relation Age of Onset  . Arthritis Mother   . Arthritis Father   . Diabetes Father   . Lupus Father   . Gout Father   . Arthritis Maternal Grandmother   . Diabetes Maternal Grandmother   . Arthritis Maternal Grandfather   . Gout Maternal Grandfather   . Arthritis Paternal Grandmother   . Arthritis Paternal Grandfather   . Gout Brother   . Gout Brother     Social History Social History  Substance Use Topics  . Smoking status: Former Smoker    Types: Cigarettes  . Smokeless tobacco: Never Used  . Alcohol use No    Review of Systems  Constitutional: Negative for fever. Eyes: Negative for visual changes. ENT: Negative for sore throat. Neck: No neck pain  Cardiovascular: Negative for chest pain. Respiratory: Negative for shortness of breath. Gastrointestinal: + epigastric abdominal pain, vomiting and diarrhea. Genitourinary: Negative for dysuria. Musculoskeletal: Negative for back pain. Skin: Negative for rash. Neurological: Negative for headaches, weakness or numbness. Psych: No SI or HI  ____________________________________________   PHYSICAL EXAM:  VITAL SIGNS: ED Triage Vitals  Enc Vitals Group     BP 06/24/16 1252 (!) 146/105     Pulse Rate 06/24/16 1252 (!) 131     Resp 06/24/16 1252 20     Temp 06/24/16 1252 98.4 F (36.9 C)     Temp Source 06/24/16 1252 Oral     SpO2 06/24/16 1252 98 %     Weight 06/24/16 1252 220 lb (99.8 kg)     Height 06/24/16 1252 5\' 8"  (1.727 m)     Head Circumference --      Peak Flow --      Pain Score 06/24/16 1305 6     Pain Loc --      Pain Edu? --      Excl. in GC? --     Constitutional: Alert and oriented, in significant distress, pale and diaphoretic.  HEENT:      Head: Normocephalic and atraumatic.         Eyes: Conjunctivae are normal. Sclera is non-icteric. EOMI. PERRL      Mouth/Throat: Mucous membranes are dry.       Neck: Supple with no signs of  meningismus. Cardiovascular: Regular rate and rhythm. No murmurs, gallops, or rubs. 2+ symmetrical distal pulses are present in all extremities. No JVD. Respiratory: Normal respiratory effort. Lungs are clear to auscultation bilaterally. No wheezes, crackles, or rhonchi.  Gastrointestinal: Soft, Significant tenderness to palpation on the epigastric region, and non distended with positive bowel sounds. No rebound or guarding. Musculoskeletal: Nontender with normal range of motion in all extremities. No edema, cyanosis, or erythema of extremities. Neurologic: Normal speech and language. Face is symmetric. Moving all extremities. No gross focal neurologic deficits are appreciated. Skin: Skin is warm, dry and intact. No rash noted. Psychiatric: Mood and affect are normal. Speech and behavior are  normal.  ____________________________________________   LABS (all labs ordered are listed, but only abnormal results are displayed)  Labs Reviewed  COMPREHENSIVE METABOLIC PANEL - Abnormal; Notable for the following:       Result Value   Glucose, Bld 113 (*)    Total Protein 8.3 (*)    All other components within normal limits  CBC - Abnormal; Notable for the following:    WBC 17.4 (*)    All other components within normal limits  URINALYSIS, COMPLETE (UACMP) WITH MICROSCOPIC - Abnormal; Notable for the following:    Color, Urine YELLOW (*)    APPearance CLEAR (*)    All other components within normal limits  LACTIC ACID, PLASMA - Abnormal; Notable for the following:    Lactic Acid, Venous 2.7 (*)    All other components within normal limits  LACTIC ACID, PLASMA - Abnormal; Notable for the following:    Lactic Acid, Venous 2.3 (*)    All other components within normal limits  LIPASE, BLOOD  LACTIC ACID, PLASMA   ____________________________________________  EKG  none ____________________________________________  RADIOLOGY  CT a/p:  No acute finding.  Asymmetric elevation of the  right hemidiaphragm, uncertain etiology. ____________________________________________   PROCEDURES  Procedure(s) performed: None Procedures Critical Care performed:  None ____________________________________________   INITIAL IMPRESSION / ASSESSMENT AND PLAN / ED COURSE  36 y.o. male the history of hypertension, hyperlipidemia, and narcolepsy who presents for evaluation of severe epigastric abdominal pain of sudden onset sharp associated with multiple episodes of vomiting and diarrhea. Patient is ill-appearing, pale and diaphoretic, very dry mucous membranes and significant tenderness in the epigastric region. We'll send patient for a CT to rule out mesenteric ischemia versus dissection versus perforated ulcer. We'll check blood work including CBC, CMP, lipase, lactic acid. We'll give IV fluids, IV morphine, and IV Zofran for symptoms.  Clinical Course as of Jun 25 1923  Tue Jun 24, 2016  [id number] Patient feels markedly improved. Lactic acid is now 1.4 after 4 L of normal saline. Vital signs have normalized. CT a/p with no acute findigns. Patient with no episodes of vomiting or diarrhea in the emergency room. Will be discharged home on Zofran and supportive care.  [CV]    Clinical Course User Index [CV] Nita Sickle, MD    Pertinent labs & imaging results that were available during my care of the patient were reviewed by me and considered in my medical decision making (see chart for details).    ____________________________________________   FINAL CLINICAL IMPRESSION(S) / ED DIAGNOSES  Final diagnoses:  Epigastric pain  Nausea vomiting and diarrhea  Gastroenteritis      NEW MEDICATIONS STARTED DURING THIS VISIT:  New Prescriptions   ONDANSETRON (ZOFRAN) 4 MG TABLET    Take 1 tablet (4 mg total) by mouth every 8 (eight) hours as needed for nausea or vomiting.     Note:  This document was prepared using Dragon voice recognition software and may include unintentional  dictation errors.    Nita Sickle, MD 06/24/16 [phone]

## 2016-06-24 NOTE — ED Notes (Signed)
Pt became nauseas upon standing and ambulating to wheelchair for discharge.  MD notified; see new orders.

## 2016-06-24 NOTE — Telephone Encounter (Signed)
FYI: see team health note below, patient sent to ER today for further eval.

## 2016-06-24 NOTE — ED Triage Notes (Signed)
Pt with NVD and epigastric pain 6/10 since 0330 this am. Pt pale, hr 130 in triage. Pain does not increase with palpation.

## 2016-07-16 ENCOUNTER — Encounter: Payer: Self-pay | Admitting: Family Medicine

## 2016-08-19 DIAGNOSIS — F988 Other specified behavioral and emotional disorders with onset usually occurring in childhood and adolescence: Secondary | ICD-10-CM | POA: Diagnosis not present

## 2016-08-19 DIAGNOSIS — G47419 Narcolepsy without cataplexy: Secondary | ICD-10-CM | POA: Diagnosis not present

## 2016-08-19 DIAGNOSIS — F39 Unspecified mood [affective] disorder: Secondary | ICD-10-CM | POA: Diagnosis not present

## 2016-08-19 DIAGNOSIS — M542 Cervicalgia: Secondary | ICD-10-CM | POA: Diagnosis not present

## 2016-08-19 DIAGNOSIS — G43009 Migraine without aura, not intractable, without status migrainosus: Secondary | ICD-10-CM | POA: Diagnosis not present

## 2016-11-19 ENCOUNTER — Encounter: Payer: Self-pay | Admitting: Family Medicine

## 2016-11-19 MED ORDER — HYDROCHLOROTHIAZIDE 25 MG PO TABS: 25.0000 mg | ORAL_TABLET | Freq: Every day | ORAL | 0 refills | Status: DC

## 2017-02-14 IMAGING — CR DG CHEST 2V
2 series · 2 of 2 positions shown · non-contrast
Comparison: None.

CLINICAL DATA: Left-sided chest pain

EXAM:
CHEST  2 VIEW

[view not recorded (1 of 2)]
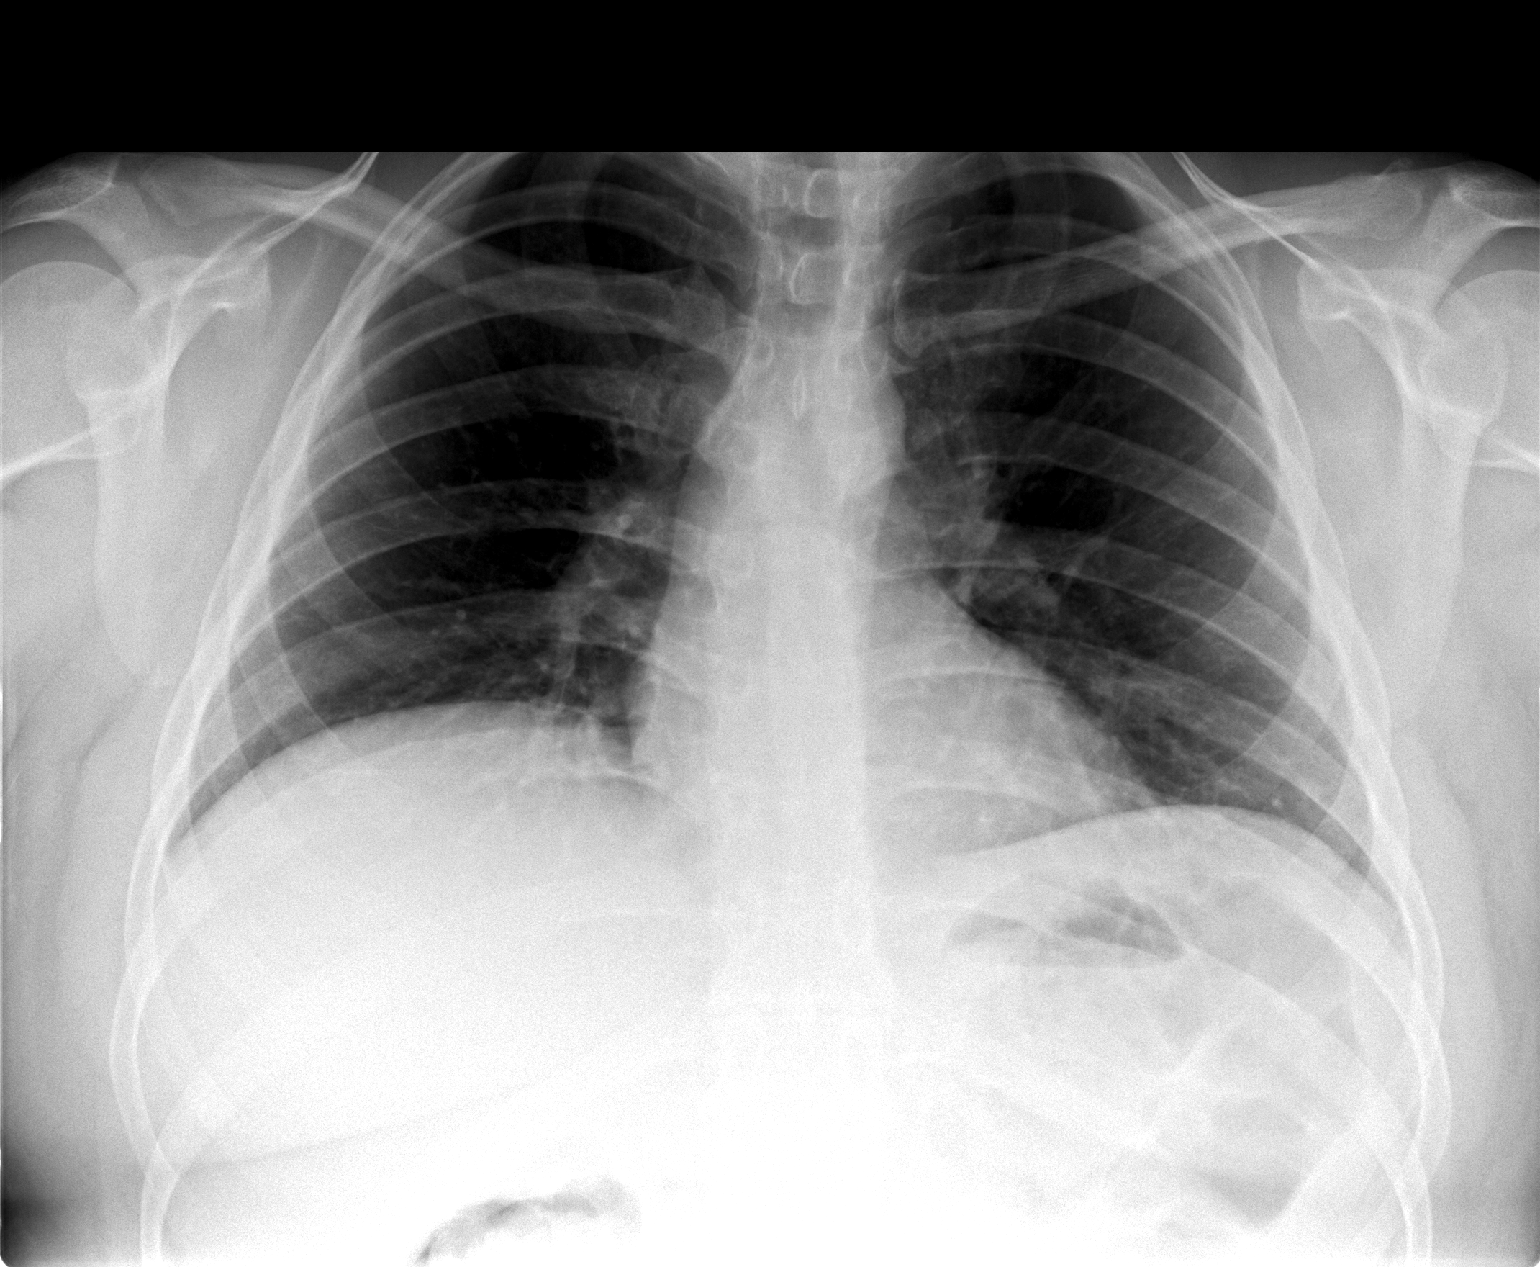

[view not recorded (2 of 2)]
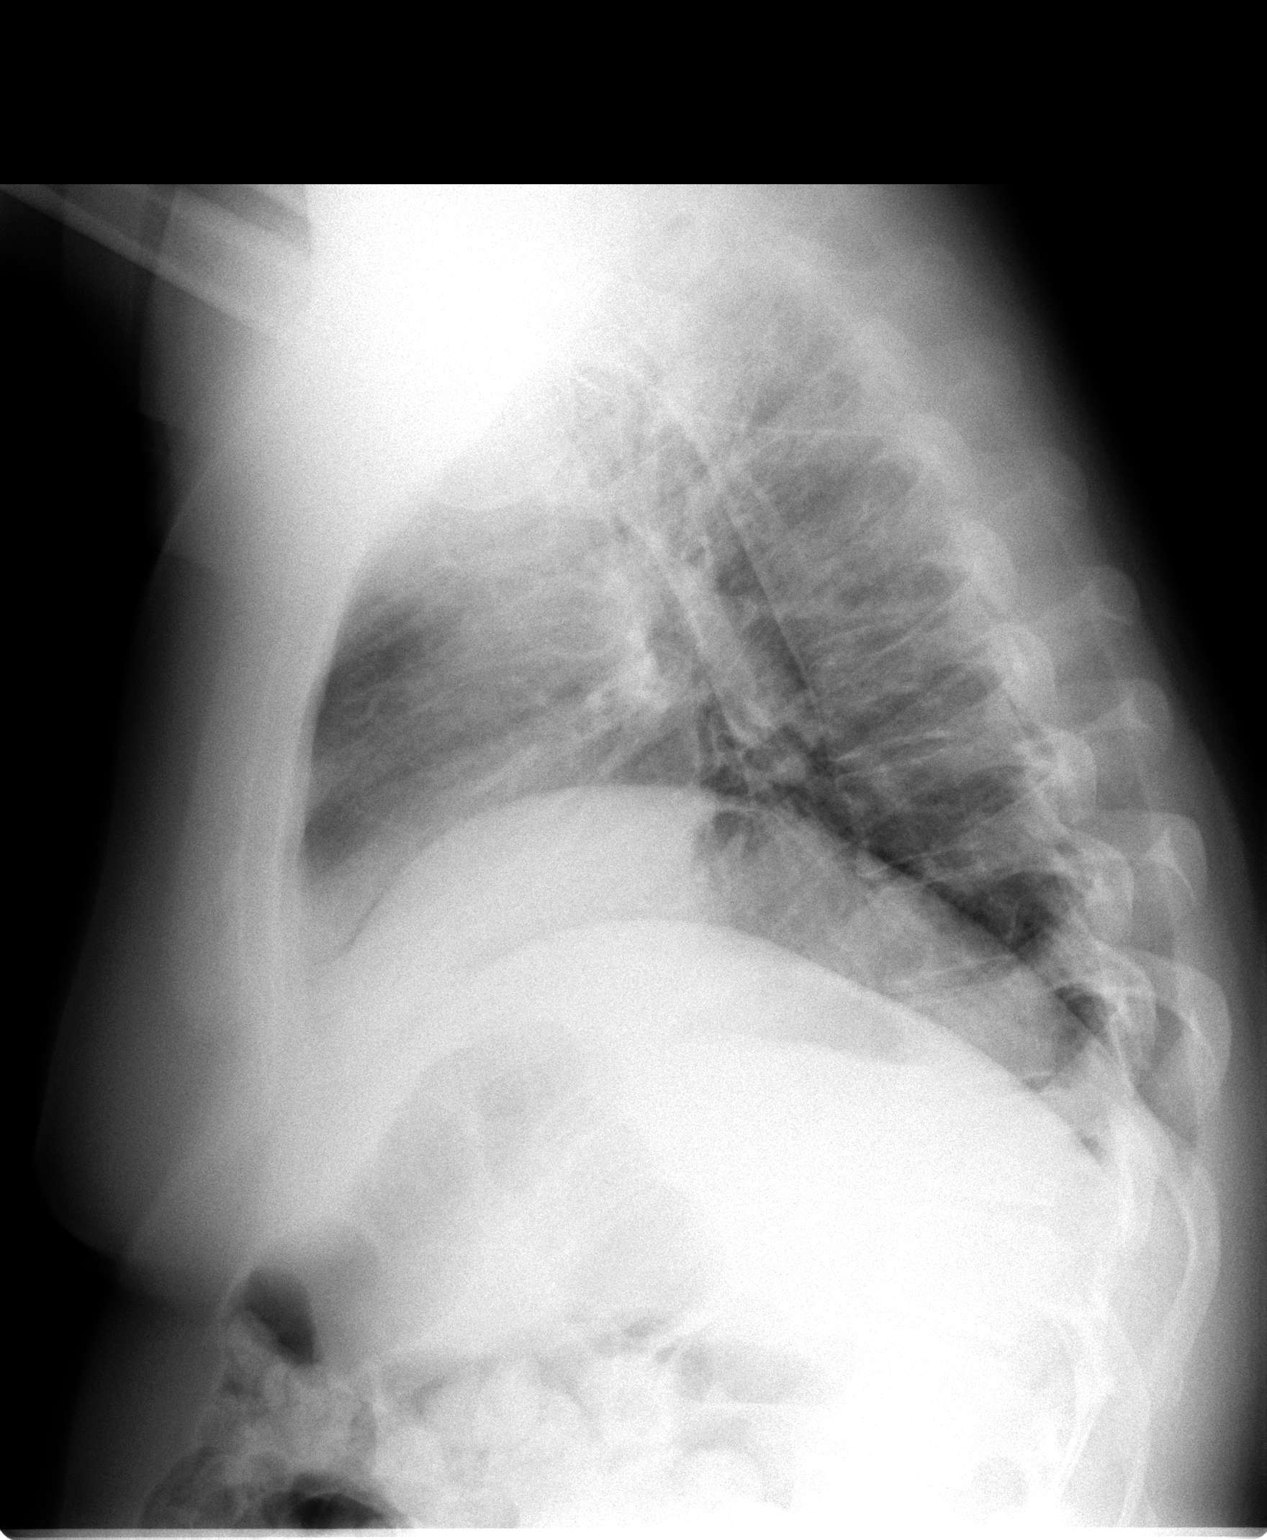

[2 of 2 positions shown; findings below may reference images not displayed]

FINDINGS: Lungs are clear. Heart size and pulmonary vascularity are normal. No
adenopathy. No pneumothorax. There is mild upper thoracic
levoscoliosis.
IMPRESSION: No edema or consolidation.

## 2017-02-16 ENCOUNTER — Other Ambulatory Visit: Payer: Self-pay | Admitting: Family Medicine

## 2017-02-16 NOTE — Telephone Encounter (Signed)
Ok to refill 90 each, 0 ref  But schedule a CPX with me

## 2017-02-16 NOTE — Telephone Encounter (Signed)
Last office visit 02/18/2016 with Allayne Gitelman for cough.  Last CPE 01/08/2016.  Seen at [facility] 11/11/2016 for HTN.  Refill??

## 2017-02-18 ENCOUNTER — Telehealth: Payer: Self-pay | Admitting: Family Medicine

## 2017-02-18 NOTE — Telephone Encounter (Signed)
Left pt message asking to call Revonda Standardllison back directly at [phone] to schedule CPE with Dr. Patsy Lageropland and fasting labs 1 week prior.

## 2017-03-26 NOTE — Telephone Encounter (Signed)
Left pt message asking to call Allison back directly at [phone] to schedule CPE with Dr. Copland and fasting labs 1 week prior. ° ° °

## 2017-05-27 ENCOUNTER — Other Ambulatory Visit: Payer: Self-pay | Admitting: *Deleted

## 2017-05-27 MED ORDER — HYDROCHLOROTHIAZIDE 25 MG PO TABS: 25.0000 mg | ORAL_TABLET | Freq: Every day | ORAL | Status: DC

## 2017-05-27 MED ORDER — LOSARTAN POTASSIUM 50 MG PO TABS: 50.0000 mg | ORAL_TABLET | Freq: Every day | ORAL | 0 refills | Status: DC

## 2017-05-27 NOTE — Telephone Encounter (Signed)
Last office visit 02/17/2016 with Allayne GitelmanK. Clark for cough.  Last seen by PCP 01/08/2016.  No future appointments.  Was told on last refill in 01/2017 that an appointment was needed.  Refill?

## 2017-06-30 ENCOUNTER — Other Ambulatory Visit: Payer: Self-pay | Admitting: Family Medicine

## 2017-06-30 MED ORDER — HYDROCHLOROTHIAZIDE 25 MG PO TABS: 25.0000 mg | ORAL_TABLET | Freq: Every day | ORAL | 0 refills | Status: DC

## 2017-06-30 MED ORDER — LOSARTAN POTASSIUM 50 MG PO TABS: 50.0000 mg | ORAL_TABLET | Freq: Every day | ORAL | 0 refills | Status: DC

## 2017-07-03 ENCOUNTER — Other Ambulatory Visit (INDEPENDENT_AMBULATORY_CARE_PROVIDER_SITE_OTHER): Payer: Managed Care, Other (non HMO)

## 2017-07-03 DIAGNOSIS — Z0000 Encounter for general adult medical examination without abnormal findings: Secondary | ICD-10-CM

## 2017-07-03 DIAGNOSIS — M109 Gout, unspecified: Secondary | ICD-10-CM

## 2017-07-03 LAB — HEPATIC FUNCTION PANEL
ALT: 21 U/L (ref 0–53)
AST: 15 U/L (ref 0–37)
Albumin: 4.4 g/dL (ref 3.5–5.2)
Alkaline Phosphatase: 74 U/L (ref 39–117)
Bilirubin, Direct: 0.1 mg/dL (ref 0.0–0.3)
Total Bilirubin: 0.4 mg/dL (ref 0.2–1.2)
Total Protein: 7.1 g/dL (ref 6.0–8.3)

## 2017-07-03 LAB — CBC WITH DIFFERENTIAL/PLATELET
BASOS ABS: 0 10*3/uL (ref 0.0–0.1)
Basophils Relative: 0.7 % (ref 0.0–3.0)
EOS ABS: 0.1 10*3/uL (ref 0.0–0.7)
Eosinophils Relative: 1.6 % (ref 0.0–5.0)
HCT: 47.3 % (ref 39.0–52.0)
Hemoglobin: 15.6 g/dL (ref 13.0–17.0)
LYMPHS ABS: 2.8 10*3/uL (ref 0.7–4.0)
Lymphocytes Relative: 39.7 % (ref 12.0–46.0)
MCHC: 32.9 g/dL (ref 30.0–36.0)
MCV: 90.6 fl (ref 78.0–100.0)
MONO ABS: 0.6 10*3/uL (ref 0.1–1.0)
Monocytes Relative: 8.6 % (ref 3.0–12.0)
NEUTROS PCT: 49.4 % (ref 43.0–77.0)
Neutro Abs: 3.6 10*3/uL (ref 1.4–7.7)
Platelets: 266 10*3/uL (ref 150.0–400.0)
RBC: 5.23 Mil/uL (ref 4.22–5.81)
RDW: 13.6 % (ref 11.5–15.5)
WBC: 7.2 10*3/uL (ref 4.0–10.5)

## 2017-07-03 LAB — LIPID PANEL
CHOL/HDL RATIO: 5
Cholesterol: 210 mg/dL — ABNORMAL HIGH (ref 0–200)
HDL: 39 mg/dL — AB (ref >39.00)
NonHDL: 171.32
Triglycerides: 286 mg/dL — ABNORMAL HIGH (ref 0.0–149.0)
VLDL: 57.2 mg/dL — ABNORMAL HIGH (ref 0.0–40.0)

## 2017-07-03 LAB — BASIC METABOLIC PANEL
BUN: 13 mg/dL (ref 6–23)
CHLORIDE: 100 meq/L (ref 96–112)
CO2: 34 mEq/L — ABNORMAL HIGH (ref 19–32)
Calcium: 10 mg/dL (ref 8.4–10.5)
Creatinine, Ser: 1.03 mg/dL (ref 0.40–1.50)
GFR: 87.14 mL/min (ref >60.00)
Glucose, Bld: 96 mg/dL (ref 70–99)
POTASSIUM: 4 meq/L (ref 3.5–5.1)
SODIUM: 140 meq/L (ref 135–145)

## 2017-07-03 LAB — URIC ACID: URIC ACID, SERUM: 9 mg/dL — AB (ref 4.0–7.8)

## 2017-07-03 LAB — LDL CHOLESTEROL, DIRECT: Direct LDL: 150 mg/dL

## 2017-07-06 ENCOUNTER — Other Ambulatory Visit: Payer: Self-pay

## 2017-07-09 ENCOUNTER — Encounter: Payer: Self-pay | Admitting: Family Medicine

## 2017-07-09 ENCOUNTER — Other Ambulatory Visit: Payer: Self-pay

## 2017-07-09 ENCOUNTER — Ambulatory Visit (INDEPENDENT_AMBULATORY_CARE_PROVIDER_SITE_OTHER): Payer: Managed Care, Other (non HMO) | Admitting: Family Medicine

## 2017-07-09 VITALS — BP 124/78 | HR 65 | Temp 98.4°F | Ht 68.5 in | Wt 219.5 lb

## 2017-07-09 DIAGNOSIS — Z0000 Encounter for general adult medical examination without abnormal findings: Secondary | ICD-10-CM

## 2017-07-09 MED ORDER — ALLOPURINOL 100 MG PO TABS: ORAL_TABLET | ORAL | 0 refills | Status: DC

## 2017-07-09 MED ORDER — ALLOPURINOL 100 MG PO TABS: 200.0000 mg | ORAL_TABLET | Freq: Every day | ORAL | 3 refills | Status: DC

## 2017-07-09 NOTE — Progress Notes (Signed)
Dr. Frederico Hamman T. Alvin Rubano, MD, [facility] [place], [address] Phone: [phone] Fax: [phone]  07/09/2017  Patient: Roger Brown, MRN: [id number], DOB: Nov 26, 1980, 37 y.o.  Primary Physician:  Owens Loffler, MD   Chief Complaint  Patient presents with  . Annual Exam   Subjective:   Roger Brown is a 37 y.o. pleasant patient who presents with the following:  Preventative Health Maintenance Visit:  Health Maintenance Summary Reviewed and updated, unless pt declines services.  Tobacco History Reviewed. Alcohol: No concerns, no excessive use Exercise Habits: Some activity, rec at least 30 mins 5 times a week STD concerns: no risk or activity to increase risk Drug Use: None Encouraged self-testicular check  Gout acting up from time to time. Uric acid is at 9. Having at least 10 gout.   Ankle, elbow.   Water, low purine diet.   Allopurinol.    Health Maintenance  Topic Date Due  . HIV Screening  02/01/1997  . TETANUS/TDAP  02/01/2001  . INFLUENZA VACCINE  Completed   Immunization History  Administered Date(s) Administered  . Influenza,inj,Quad PF,6+ Mos 03/28/2015, 04/23/2017   Patient Active Problem List   Diagnosis Date Noted  . Hyperlipidemia 01/08/2016  . Left knee pain 12/26/2014  . Polyarthralgia 12/22/2014  . Gout 07/01/2013  . Migraine 07/01/2013  . Essential hypertension 07/01/2013  . Narcolepsy 07/01/2013  . Obesity (BMI 30-39.9) 06/30/2013   Past Medical History:  Diagnosis Date  . Gout 07/01/2013  . Herniated disc, cervical   . Hydrocele   . Hyperlipidemia 01/08/2016  . Hypertension   . Migraine 07/01/2013  . Narcolepsy    Past Surgical History:  Procedure Laterality Date  . COSMETIC SURGERY    . TONSILLECTOMY AND ADENOIDECTOMY     Social History   Socioeconomic History  . Marital status: Married    Spouse name: Not on file  . Number of children: Not on file  .  Years of education: Not on file  . Highest education level: Not on file  Social Needs  . Financial resource strain: Not on file  . Food insecurity - worry: Not on file  . Food insecurity - inability: Not on file  . Transportation needs - medical: Not on file  . Transportation needs - non-medical: Not on file  Occupational History  . Occupation: nuclear Engineer, production: [employer]  Tobacco Use  . Smoking status: Former Smoker    Types: Cigarettes  . Smokeless tobacco: Never Used  Substance and Sexual Activity  . Alcohol use: No    Alcohol/week: 0.0 oz  . Drug use: No  . Sexual activity: Yes    Partners: Female  Other Topics Concern  . Not on file  Social History Narrative   Former patient of Dr. Junius Roads         Family History  Problem Relation Age of Onset  . Arthritis Mother   . Arthritis Father   . Diabetes Father   . Lupus Father   . Gout Father   . Arthritis Maternal Grandmother   . Diabetes Maternal Grandmother   . Arthritis Maternal Grandfather   . Gout Maternal Grandfather   . Arthritis Paternal Grandmother   . Arthritis Paternal Grandfather   . Gout Brother   . Gout Brother    Allergies  Allergen Reactions  . Amoxicillin Hives and Swelling    Medication list  has been reviewed and updated.   General: Denies fever, chills, sweats. No significant weight loss. Eyes: Denies blurring,significant itching ENT: Denies earache, sore throat, and hoarseness. Cardiovascular: Denies chest pains, palpitations, dyspnea on exertion Respiratory: Denies cough, dyspnea at rest,wheeezing Breast: no concerns about lumps GI: Denies nausea, vomiting, diarrhea, constipation, change in bowel habits, abdominal pain, melena, hematochezia GU: Denies penile discharge, ED, urinary flow / outflow problems. No STD concerns. Musculoskeletal: Denies back pain, joint pain Derm: Denies rash, itching Neuro: Denies  paresthesias, frequent falls,  frequent headaches Psych: Denies depression, anxiety Endocrine: Denies cold intolerance, heat intolerance, polydipsia Heme: Denies enlarged lymph nodes Allergy: No hayfever  Objective:   BP 124/78   Pulse 65   Temp 98.4 F (36.9 C) (Oral)   Ht 5' 8.5" (1.74 m)   Wt 219 lb 8 oz (99.6 kg)   BMI 32.89 kg/m  Ideal Body Weight: Weight in (lb) to have BMI = 25: 166.5  No exam data present  GEN: well developed, well nourished, no acute distress Eyes: conjunctiva and lids normal, PERRLA, EOMI ENT: TM clear, nares clear, oral exam WNL Neck: supple, no lymphadenopathy, no thyromegaly, no JVD Pulm: clear to auscultation and percussion, respiratory effort normal CV: regular rate and rhythm, S1-S2, no murmur, rub or gallop, no bruits, peripheral pulses normal and symmetric, no cyanosis, clubbing, edema or varicosities GI: soft, non-tender; no hepatosplenomegaly, masses; active bowel sounds all quadrants GU: no hernia, testicular mass, penile discharge Lymph: no cervical, axillary or inguinal adenopathy MSK: gait normal, muscle tone and strength WNL, no joint swelling, effusions, discoloration, crepitus  SKIN: clear, good turgor, color WNL, no rashes, lesions, or ulcerations Neuro: normal mental status, normal strength, sensation, and motion Psych: alert; oriented to person, place and time, normally interactive and not anxious or depressed in appearance.  All labs reviewed with patient.  Lipids:    Component Value Date/Time   CHOL 210 (H) 07/03/2017 1051   TRIG 286.0 (H) 07/03/2017 1051   HDL 39.00 (L) 07/03/2017 1051   LDLDIRECT 150.0 07/03/2017 1051   VLDL 57.2 (H) 07/03/2017 1051   CHOLHDL 5 07/03/2017 1051   CBC: CBC Latest Ref Rng & Units 07/03/2017 06/24/2016 01/04/2016  WBC 4.0 - 10.5 K/uL 7.2 17.4(H) 7.1  Hemoglobin 13.0 - 17.0 g/dL 15.6 17.1 14.9  Hematocrit 39.0 - 52.0 % 47.3 50.4 43.7  Platelets 150.0 - 400.0 K/uL 266.0 295 741.2    Basic Metabolic Panel:    Component  Value Date/Time   NA 140 07/03/2017 1051   K 4.0 07/03/2017 1051   CL 100 07/03/2017 1051   CO2 34 (H) 07/03/2017 1051   BUN 13 07/03/2017 1051   CREATININE 1.03 07/03/2017 1051   GLUCOSE 96 07/03/2017 1051   CALCIUM 10.0 07/03/2017 1051   Hepatic Function Latest Ref Rng & Units 07/03/2017 06/24/2016 01/04/2016  Total Protein 6.0 - 8.3 g/dL 7.1 8.3(H) 7.4  Albumin 3.5 - 5.2 g/dL 4.4 4.7 4.5  AST 0 - 37 U/L 15 26 51(H)  ALT 0 - 53 U/L 21 30 55(H)  Alk Phosphatase 39 - 117 U/L 74 59 77  Total Bilirubin 0.2 - 1.2 mg/dL 0.4 0.9 0.6  Bilirubin, Direct 0.0 - 0.3 mg/dL 0.1 - 0.1    Lab Results  Component Value Date   TSH 1.49 01/04/2016   No results found for: PSA  Assessment and Plan:   Healthcare maintenance  Titrate up allopurinol for gout  Health Maintenance Exam: The patient's preventative maintenance and recommended screening tests  for an annual wellness exam were reviewed in full today. Brought up to date unless services declined.  Counselled on the importance of diet, exercise, and its role in overall health and mortality. The patient's FH and SH was reviewed, including their home life, tobacco status, and drug and alcohol status.  Follow-up in 1 year for physical exam or additional follow-up below.  Follow-up: No Follow-up on file. Or follow-up in 1 year if not noted.  Meds ordered this encounter  Medications  . allopurinol (ZYLOPRIM) 100 MG tablet    Sig: 1 tab daily for 1 week, then 2 tabs po daily    Dispense:  60 tablet    Refill:  0  . allopurinol (ZYLOPRIM) 100 MG tablet    Sig: Take 2 tablets (200 mg total) by mouth daily.    Dispense:  180 tablet    Refill:  3    refills   Medications Discontinued During This Encounter  Medication Reason  . azithromycin (ZITHROMAX) 250 MG tablet Completed Course   Signed,  Frederico Hamman T. Natalina Wieting, MD   Allergies as of 07/09/2017      Reactions   Amoxicillin Hives, Swelling      Medication List        Accurate as  of 07/09/17  2:01 PM. Always use your most recent med list.          albuterol 108 (90 Base) MCG/ACT inhaler Commonly known as:  PROVENTIL HFA;VENTOLIN HFA Inhale 2 puffs into the lungs every 6 (six) hours as needed for wheezing or shortness of breath.   allopurinol 100 MG tablet Commonly known as:  ZYLOPRIM 1 tab daily for 1 week, then 2 tabs po daily   allopurinol 100 MG tablet Commonly known as:  ZYLOPRIM Take 2 tablets (200 mg total) by mouth daily.   ALPRAZolam 0.5 MG tablet Commonly known as:  XANAX Take 0.5 mg by mouth as needed for anxiety.   Armodafinil 250 MG tablet Take 250 mg by mouth every morning.   chlorpheniramine-HYDROcodone 10-8 MG/5ML Suer Commonly known as:  TUSSIONEX PENNKINETIC ER Take 5 mLs by mouth at bedtime as needed.   COLCRYS 0.6 MG tablet Generic drug:  colchicine Take 0.6 mg by mouth as needed.   divalproex 500 MG DR tablet Commonly known as:  DEPAKOTE Take 500 mg in the morning and 1000 mg at bedtime.   eletriptan 40 MG tablet Commonly known as:  RELPAX Take 40 mg by mouth as needed for migraine or headache. One tablet by mouth at onset of headache. May repeat in 2 hours if headache persists or recurs.   hydrochlorothiazide 25 MG tablet Commonly known as:  HYDRODIURIL Take 1 tablet (25 mg total) by mouth daily.   HYDROcodone-homatropine 5-1.5 MG/5ML syrup Commonly known as:  HYCODAN Take 5 mLs by mouth every 8 (eight) hours as needed for cough.   losartan 50 MG tablet Commonly known as:  COZAAR Take 1 tablet (50 mg total) by mouth daily.   methylphenidate 10 MG tablet Commonly known as:  RITALIN Take 10 mg by mouth 2 (two) times daily.   ondansetron 4 MG tablet Commonly known as:  ZOFRAN Take 1 tablet (4 mg total) by mouth every 8 (eight) hours as needed for nausea or vomiting.   predniSONE 10 MG tablet Commonly known as:  DELTASONE Take three tablets for 2 days, then two tablets for 2 days, then one tablet for 2 days.     vitamin C 500 MG tablet Commonly known as:  ASCORBIC ACID Take 500 mg by mouth 2 (two) times daily. Reported on 12/07/2015

## 2017-07-30 ENCOUNTER — Other Ambulatory Visit: Payer: Self-pay | Admitting: Family Medicine

## 2017-09-03 ENCOUNTER — Encounter: Payer: Self-pay | Admitting: Family Medicine

## 2017-09-03 ENCOUNTER — Other Ambulatory Visit: Payer: Self-pay | Admitting: Family Medicine

## 2017-09-03 MED ORDER — COLCHICINE 0.6 MG PO TABS: 0.6000 mg | ORAL_TABLET | Freq: Two times a day (BID) | ORAL | 5 refills | Status: DC

## 2017-09-03 MED ORDER — INDOMETHACIN 50 MG PO CAPS: 50.0000 mg | ORAL_CAPSULE | Freq: Three times a day (TID) | ORAL | 5 refills | Status: DC

## 2017-09-03 NOTE — Telephone Encounter (Signed)
Please put in specific dosing instructions.  Pharmacy will not fill just as needed anymore.

## 2017-09-03 NOTE — Addendum Note (Signed)
Addended by: Damita LackLORING, Meyah Corle S on: 09/03/2017 08:51 AM   Modules accepted: Orders

## 2018-01-07 ENCOUNTER — Other Ambulatory Visit: Payer: Self-pay | Admitting: Family Medicine

## 2018-04-13 IMAGING — DX DG CHEST 2V
2 series · 2 of 2 positions shown · non-contrast
Comparison: PA and lateral chest x-ray August 02, 2015

CLINICAL DATA: Two days of cough with wheezing, fever, and chest
pain. Also shortness of breath. Former smoker. History of asthma
-COPD and unexplained weight loss.

EXAM:
CHEST  2 VIEW

[chest pa]
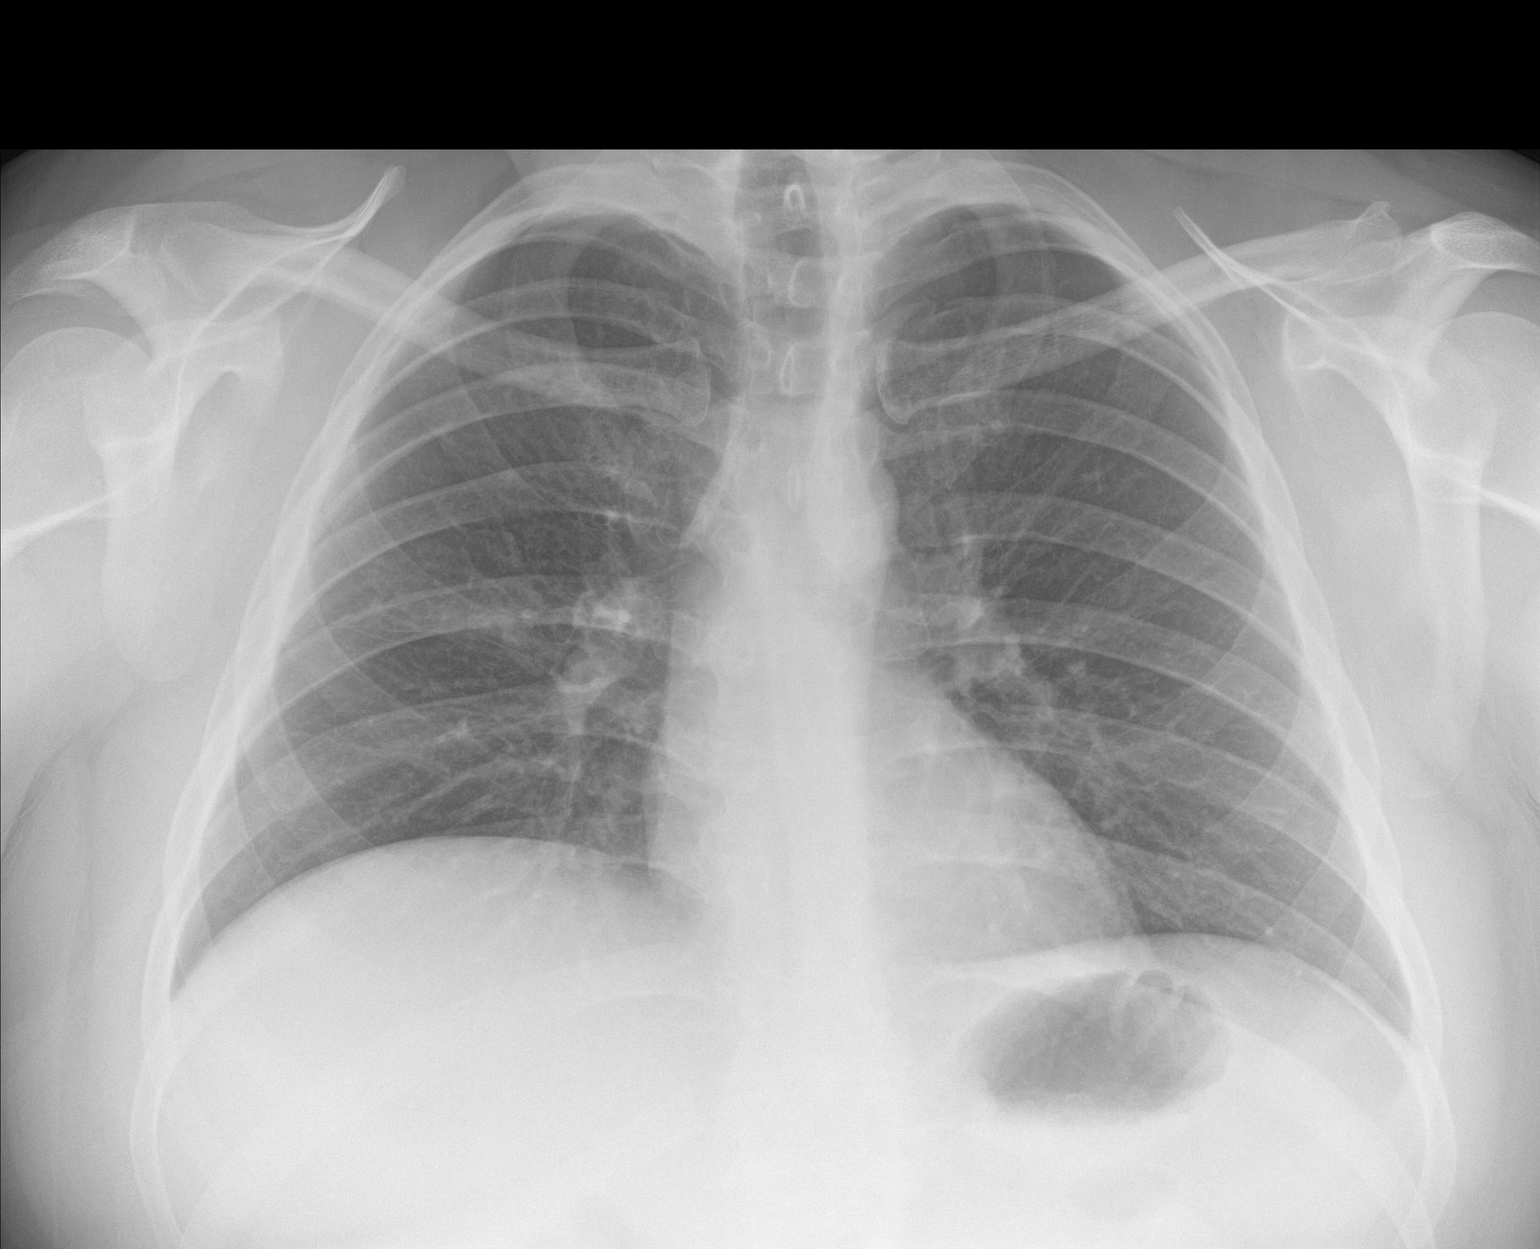

[chest lat]
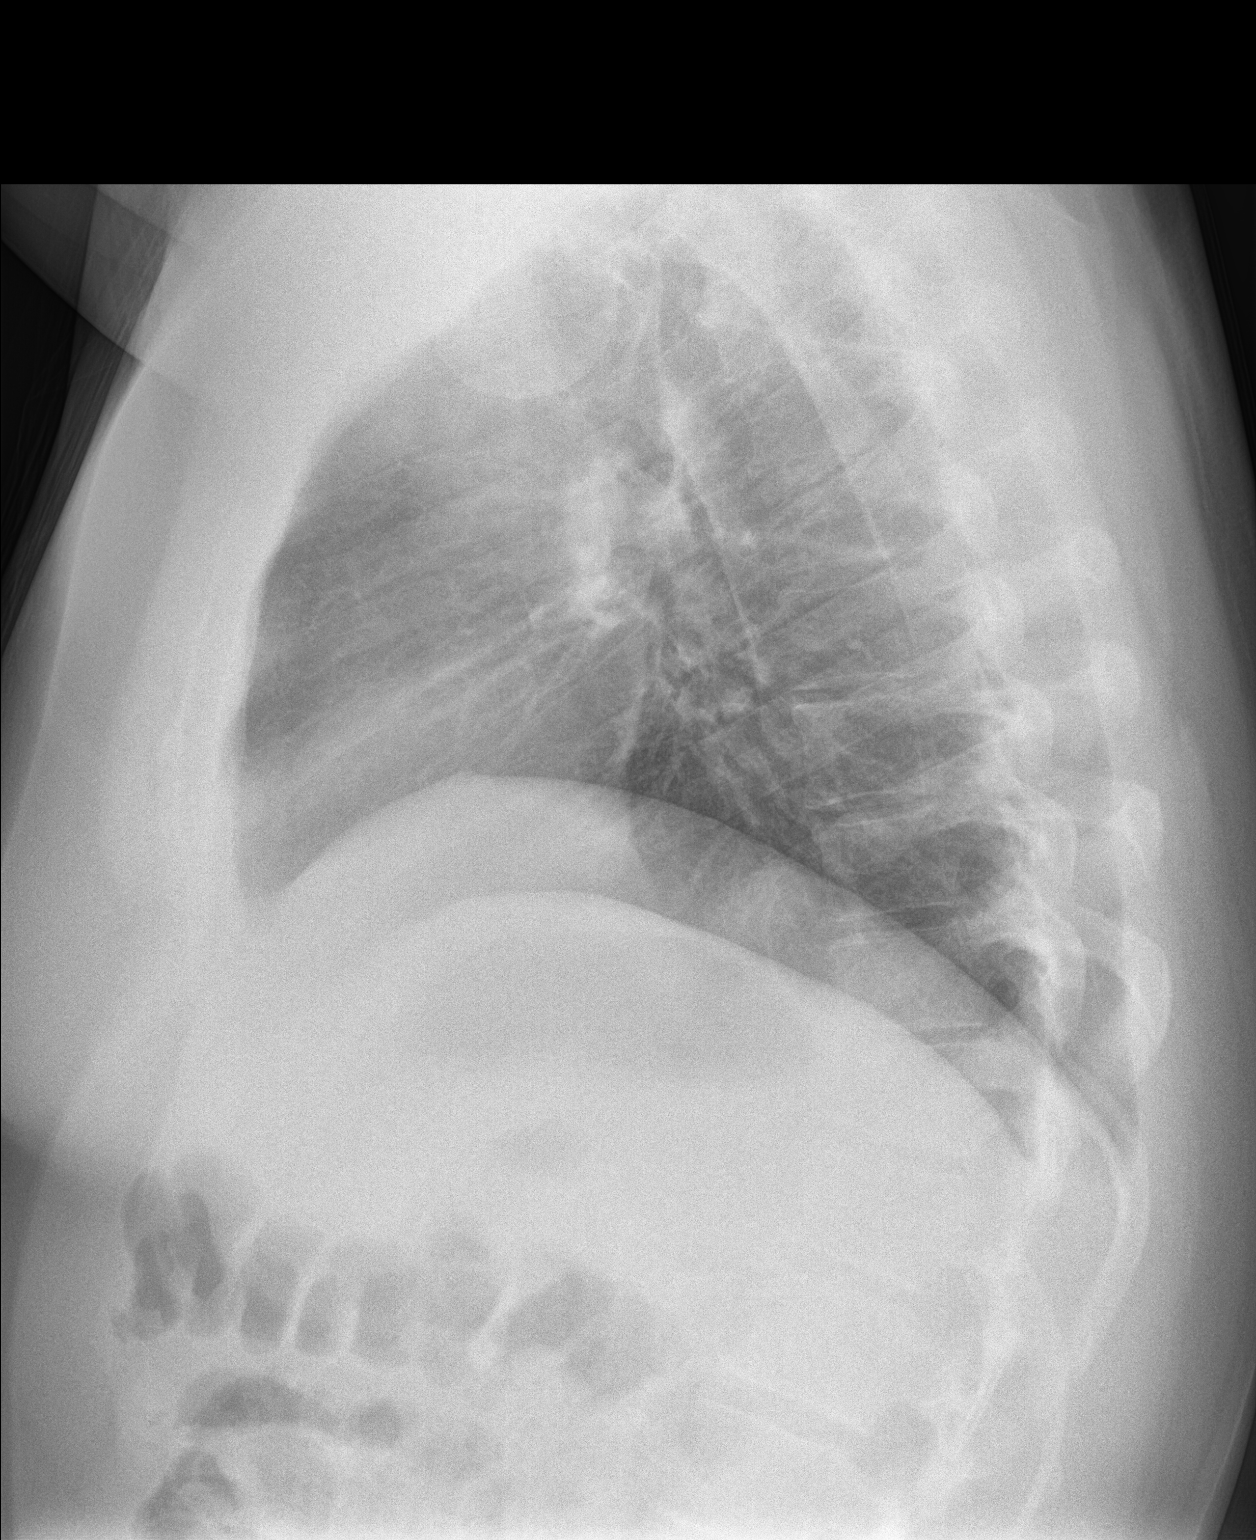

[2 of 2 positions shown; findings below may reference images not displayed]

FINDINGS: The lungs are mildly hypoinflated. There is no focal infiltrate.
There is no pleural effusion, pneumothorax, or pneumomediastinum. No
pulmonary parenchymal or mediastinal or hilar mass is observed. The
heart and pulmonary vascularity are normal. The bony thorax is
unremarkable.
IMPRESSION: Mild hypoinflation.  There is no acute cardiopulmonary abnormality.

## 2018-07-22 ENCOUNTER — Other Ambulatory Visit: Payer: Self-pay | Admitting: Family Medicine

## 2018-08-18 ENCOUNTER — Other Ambulatory Visit: Payer: Self-pay | Admitting: Family Medicine

## 2018-08-18 DIAGNOSIS — Z0000 Encounter for general adult medical examination without abnormal findings: Secondary | ICD-10-CM

## 2018-08-18 DIAGNOSIS — M109 Gout, unspecified: Secondary | ICD-10-CM

## 2018-08-18 DIAGNOSIS — Z131 Encounter for screening for diabetes mellitus: Secondary | ICD-10-CM

## 2018-08-23 ENCOUNTER — Other Ambulatory Visit: Payer: Self-pay | Admitting: *Deleted

## 2018-08-23 MED ORDER — LOSARTAN POTASSIUM 50 MG PO TABS: 50.0000 mg | ORAL_TABLET | Freq: Every day | ORAL | 0 refills | Status: DC

## 2018-08-23 MED ORDER — HYDROCHLOROTHIAZIDE 25 MG PO TABS: 25.0000 mg | ORAL_TABLET | Freq: Every day | ORAL | 0 refills | Status: DC

## 2018-08-26 ENCOUNTER — Other Ambulatory Visit (INDEPENDENT_AMBULATORY_CARE_PROVIDER_SITE_OTHER): Payer: Managed Care, Other (non HMO)

## 2018-08-26 DIAGNOSIS — M109 Gout, unspecified: Secondary | ICD-10-CM | POA: Diagnosis not present

## 2018-08-26 DIAGNOSIS — Z0000 Encounter for general adult medical examination without abnormal findings: Secondary | ICD-10-CM | POA: Diagnosis not present

## 2018-08-26 DIAGNOSIS — Z131 Encounter for screening for diabetes mellitus: Secondary | ICD-10-CM

## 2018-08-26 LAB — HEPATIC FUNCTION PANEL
ALT: 15 U/L (ref 0–53)
AST: 13 U/L (ref 0–37)
Albumin: 4.6 g/dL (ref 3.5–5.2)
Alkaline Phosphatase: 57 U/L (ref 39–117)
BILIRUBIN DIRECT: 0.1 mg/dL (ref 0.0–0.3)
Total Bilirubin: 0.4 mg/dL (ref 0.2–1.2)
Total Protein: 7.1 g/dL (ref 6.0–8.3)

## 2018-08-26 LAB — LIPID PANEL
Cholesterol: 189 mg/dL (ref 0–200)
HDL: 41.7 mg/dL (ref >39.00)
LDL Cholesterol: 119 mg/dL — ABNORMAL HIGH (ref 0–99)
NonHDL: 147.34
Total CHOL/HDL Ratio: 5
Triglycerides: 142 mg/dL (ref 0.0–149.0)
VLDL: 28.4 mg/dL (ref 0.0–40.0)

## 2018-08-26 LAB — CBC WITH DIFFERENTIAL/PLATELET
BASOS ABS: 0 10*3/uL (ref 0.0–0.1)
Basophils Relative: 0.4 % (ref 0.0–3.0)
EOS ABS: 0.1 10*3/uL (ref 0.0–0.7)
Eosinophils Relative: 1.4 % (ref 0.0–5.0)
HCT: 41.2 % (ref 39.0–52.0)
Hemoglobin: 14.3 g/dL (ref 13.0–17.0)
Lymphocytes Relative: 52.4 % — ABNORMAL HIGH (ref 12.0–46.0)
Lymphs Abs: 2.8 10*3/uL (ref 0.7–4.0)
MCHC: 34.6 g/dL (ref 30.0–36.0)
MCV: 87 fl (ref 78.0–100.0)
Monocytes Absolute: 0.3 10*3/uL (ref 0.1–1.0)
Monocytes Relative: 6.1 % (ref 3.0–12.0)
Neutro Abs: 2.1 10*3/uL (ref 1.4–7.7)
Neutrophils Relative %: 39.7 % — ABNORMAL LOW (ref 43.0–77.0)
Platelets: 252 10*3/uL (ref 150.0–400.0)
RBC: 4.74 Mil/uL (ref 4.22–5.81)
RDW: 13.7 % (ref 11.5–15.5)
WBC: 5.3 10*3/uL (ref 4.0–10.5)

## 2018-08-26 LAB — HEMOGLOBIN A1C: HEMOGLOBIN A1C: 5.3 % (ref 4.6–6.5)

## 2018-08-26 LAB — BASIC METABOLIC PANEL
BUN: 13 mg/dL (ref 6–23)
CO2: 30 mEq/L (ref 19–32)
Calcium: 9.8 mg/dL (ref 8.4–10.5)
Chloride: 108 mEq/L (ref 96–112)
Creatinine, Ser: 0.92 mg/dL (ref 0.40–1.50)
GFR: 92.8 mL/min (ref >60.00)
Glucose, Bld: 98 mg/dL (ref 70–99)
POTASSIUM: 4.7 meq/L (ref 3.5–5.1)
Sodium: 143 mEq/L (ref 135–145)

## 2018-08-26 LAB — URIC ACID: Uric Acid, Serum: 6.7 mg/dL (ref 4.0–7.8)

## 2018-08-29 NOTE — Progress Notes (Signed)
Dr. Frederico Hamman T. Naphtali Zywicki, MD, [facility] [place], [address] Phone: [phone] Fax: [phone]  09/02/2018  Patient: Roger Brown, MRN: [id number], DOB: 1980/08/08, 38 y.o.  Primary Physician:  Owens Loffler, MD   Chief Complaint  Patient presents with  . Annual Exam   Subjective:   Roger Brown is a 38 y.o. pleasant patient who presents with the following:  Preventative Health Maintenance Visit:  Health Maintenance Summary Reviewed and updated, unless pt declines services.  Tobacco History Reviewed. Alcohol: No concerns, no excessive use Exercise Habits: Some activity, rec at least 30 mins 5 times a week STD concerns: no risk or activity to increase risk Drug Use: None Encouraged self-testicular check  Amitryptliine - for migraine  Wt Readings from Last 3 Encounters:  09/02/18 189 lb 8 oz (86 kg)  07/09/17 219 lb 8 oz (99.6 kg)  06/24/16 220 lb (99.8 kg)     Health Maintenance  Topic Date Due  . HIV Screening  02/01/1997  . TETANUS/TDAP  09/01/2028  . INFLUENZA VACCINE  Completed   Immunization History  Administered Date(s) Administered  . Influenza,inj,Quad PF,6+ Mos 03/28/2015, 04/23/2017  . Influenza-Unspecified 03/23/2018  . Tdap 09/02/2018   Patient Active Problem List   Diagnosis Date Noted  . Hyperlipidemia 01/08/2016  . Polyarthralgia 12/22/2014  . Gout 07/01/2013  . Migraine 07/01/2013  . Essential hypertension 07/01/2013  . Narcolepsy 07/01/2013  . Obesity (BMI 30-39.9) 06/30/2013   Past Medical History:  Diagnosis Date  . Gout 07/01/2013  . Herniated disc, cervical   . Hydrocele   . Hyperlipidemia 01/08/2016  . Hypertension   . Migraine 07/01/2013  . Narcolepsy    Past Surgical History:  Procedure Laterality Date  . COSMETIC SURGERY    . TONSILLECTOMY AND ADENOIDECTOMY     Social History   Socioeconomic History  . Marital status: Married    Spouse name: Not on  file  . Number of children: Not on file  . Years of education: Not on file  . Highest education level: Not on file  Occupational History  . Occupation: nuclear Engineer, production: [employer]  Social Needs  . Financial resource strain: Not on file  . Food insecurity:    Worry: Not on file    Inability: Not on file  . Transportation needs:    Medical: Not on file    Non-medical: Not on file  Tobacco Use  . Smoking status: Former Smoker    Types: Cigarettes  . Smokeless tobacco: Never Used  Substance and Sexual Activity  . Alcohol use: No    Alcohol/week: 0.0 standard drinks  . Drug use: No  . Sexual activity: Yes    Partners: Female  Lifestyle  . Physical activity:    Days per week: Not on file    Minutes per session: Not on file  . Stress: Not on file  Relationships  . Social connections:    Talks on phone: Not on file    Gets together: Not on file    Attends religious service: Not on file    Active member of club or organization: Not on file    Attends meetings of clubs or organizations: Not on file    Relationship status: Not on file  . Intimate partner violence:    Fear of current or ex partner: Not on file    Emotionally abused: Not  on file    Physically abused: Not on file    Forced sexual activity: Not on file  Other Topics Concern  . Not on file  Social History Narrative   Former patient of Dr. Hilts         Family History  Problem Relation Age of Onset  . Arthritis Mother   . Arthritis Father   . Diabetes Father   . Lupus Father   . Gout Father   . Arthritis Maternal Grandmother   . Diabetes Maternal Grandmother   . Arthritis Maternal Grandfather   . Gout Maternal Grandfather   . Arthritis Paternal Grandmother   . Arthritis Paternal Grandfather   . Gout Brother   . Gout Brother    Allergies  Allergen Reactions  . Amoxicillin Hives and Swelling    Medication list has been reviewed and updated.   General:  Denies fever, chills, sweats. No significant weight loss. Eyes: Denies blurring,significant itching ENT: Denies earache, sore throat, and hoarseness. Cardiovascular: Denies chest pains, palpitations, dyspnea on exertion Respiratory: Denies cough, dyspnea at rest,wheeezing Breast: no concerns about lumps GI: Denies nausea, vomiting, diarrhea, constipation, change in bowel habits, abdominal pain, melena, hematochezia GU: Denies penile discharge, ED, urinary flow / outflow problems. No STD concerns. Musculoskeletal: Denies back pain, joint pain Derm: Denies rash, itching Neuro: Denies  paresthesias, frequent falls, frequent headaches Psych: Denies depression, anxiety Endocrine: Denies cold intolerance, heat intolerance, polydipsia Heme: Denies enlarged lymph nodes Allergy: No hayfever  Objective:   BP 140/80   Pulse 94   Temp 98.8 F (37.1 C) (Oral)   Ht 5' 8" (1.727 m)   Wt 189 lb 8 oz (86 kg)   BMI 28.81 kg/m  Ideal Body Weight: Weight in (lb) to have BMI = 25: 164.1  No exam data present  GEN: well developed, well nourished, no acute distress Eyes: conjunctiva and lids normal, PERRLA, EOMI ENT: TM clear, nares clear, oral exam WNL Neck: supple, no lymphadenopathy, no thyromegaly, no JVD Pulm: clear to auscultation and percussion, respiratory effort normal CV: regular rate and rhythm, S1-S2, no murmur, rub or gallop, no bruits, peripheral pulses normal and symmetric, no cyanosis, clubbing, edema or varicosities GI: soft, non-tender; no hepatosplenomegaly, masses; active bowel sounds all quadrants GU: no hernia, testicular mass, penile discharge Lymph: no cervical, axillary or inguinal adenopathy MSK: gait normal, muscle tone and strength WNL, no joint swelling, effusions, discoloration, crepitus  SKIN: clear, good turgor, color WNL, no rashes, lesions, or ulcerations Neuro: normal mental status, normal strength, sensation, and motion Psych: alert; oriented to person, place  and time, normally interactive and not anxious or depressed in appearance. All labs reviewed with patient.  Lipids: Lab Results  Component Value Date   CHOL 189 08/26/2018   Lab Results  Component Value Date   HDL 41.70 08/26/2018   Lab Results  Component Value Date   LDLCALC 119 (H) 08/26/2018   Lab Results  Component Value Date   TRIG 142.0 08/26/2018   Lab Results  Component Value Date   CHOLHDL 5 08/26/2018   CBC: CBC Latest Ref Rng & Units 08/26/2018 07/03/2017 06/24/2016  WBC 4.0 - 10.5 K/uL 5.3 7.2 17.4(H)  Hemoglobin 13.0 - 17.0 g/dL 14.3 15.6 17.1  Hematocrit 39.0 - 52.0 % 41.2 47.3 50.4  Platelets 150.0 - 400.0 K/uL 252.0 266.0 295    Basic Metabolic Panel:    Component Value Date/Time   NA 143 08/26/2018 0810   K 4.7 08/26/2018 0810   CL   108 08/26/2018 0810   CO2 30 08/26/2018 0810   BUN 13 08/26/2018 0810   CREATININE 0.92 08/26/2018 0810   GLUCOSE 98 08/26/2018 0810   CALCIUM 9.8 08/26/2018 0810   Hepatic Function Latest Ref Rng & Units 08/26/2018 07/03/2017 06/24/2016  Total Protein 6.0 - 8.3 g/dL 7.1 7.1 8.3(H)  Albumin 3.5 - 5.2 g/dL 4.6 4.4 4.7  AST 0 - 37 U/L _0 ALT 0 - 53 U/L _1 Alk Phosphatase 39 - 117 U/L 57 74 59  Total Bilirubin 0.2 - 1.2 mg/dL 0.4 0.4 0.9  Bilirubin, Direct 0.0 - 0.3 mg/dL 0.1 0.1 -    Lab Results  Component Value Date   HGBA1C 5.3 08/26/2018   Lab Results  Component Value Date   TSH 1.49 01/04/2016   No results found for: PSA  Assessment and Plan:   Healthcare maintenance - Plan: Tdap vaccine greater than or equal to 7yo IM  Need for prophylactic vaccination with combined diphtheria-tetanus-pertussis (DTP) vaccine - Plan: Tdap vaccine greater than or equal to 7yo IM  Really doing great, tremendous weight loss  Health Maintenance Exam: The patient's preventative maintenance and recommended screening tests for an annual wellness exam were reviewed in full today. Brought up to date unless services  declined.  Counselled on the importance of diet, exercise, and its role in overall health and mortality. The patient's FH and SH was reviewed, including their home life, tobacco status, and drug and alcohol status.  Follow-up in 1 year for physical exam or additional follow-up below.  Follow-up: No follow-ups on file. Or follow-up in 1 year if not noted.  Signed,  Maud Deed. Wynnie Pacetti, MD   Allergies as of 09/02/2018      Reactions   Amoxicillin Hives, Swelling      Medication List       Accurate as of September 02, 2018 10:39 AM. Always use your most recent med list.        albuterol 108 (90 Base) MCG/ACT inhaler Commonly known as:  PROVENTIL HFA;VENTOLIN HFA Inhale 2 puffs into the lungs every 6 (six) hours as needed for wheezing or shortness of breath.   allopurinol 100 MG tablet Commonly known as:  ZYLOPRIM TAKE 2 TABLETS BY MOUTH ONCE A DAY   ALPRAZolam 0.5 MG tablet Commonly known as:  XANAX Take 0.5 mg by mouth as needed for anxiety.   Armodafinil 250 MG tablet Take 250 mg by mouth every morning.   colchicine 0.6 MG tablet Commonly known as:  Colcrys Take 1 tablet (0.6 mg total) by mouth 2 (two) times daily.   divalproex 500 MG DR tablet Commonly known as:  DEPAKOTE Take 500 mg in the morning and 1000 mg at bedtime.   eletriptan 40 MG tablet Commonly known as:  RELPAX Take 40 mg by mouth as needed for migraine or headache. One tablet by mouth at onset of headache. May repeat in 2 hours if headache persists or recurs.   Emgality 120 MG/ML Soaj Generic drug:  Galcanezumab-gnlm   hydrochlorothiazide 25 MG tablet Commonly known as:  HYDRODIURIL Take 1 tablet (25 mg total) by mouth daily.   indomethacin 50 MG capsule Commonly known as:  INDOCIN Take 1 capsule (50 mg total) by mouth 3 (three) times daily with meals.   losartan 50 MG tablet Commonly known as:  COZAAR Take 1 tablet (50 mg total) by mouth daily.   methylphenidate 10 MG tablet Commonly  known as:  RITALIN Take 10  mg by mouth 2 (two) times daily.   ondansetron 4 MG tablet Commonly known as:  Zofran Take 1 tablet (4 mg total) by mouth every 8 (eight) hours as needed for nausea or vomiting.   vitamin C 500 MG tablet Commonly known as:  ASCORBIC ACID Take 500 mg by mouth 2 (two) times daily. Reported on 12/07/2015

## 2018-09-02 ENCOUNTER — Encounter: Payer: Self-pay | Admitting: Family Medicine

## 2018-09-02 ENCOUNTER — Ambulatory Visit (INDEPENDENT_AMBULATORY_CARE_PROVIDER_SITE_OTHER): Payer: Managed Care, Other (non HMO) | Admitting: Family Medicine

## 2018-09-02 ENCOUNTER — Other Ambulatory Visit: Payer: Self-pay

## 2018-09-02 VITALS — BP 140/80 | HR 94 | Temp 98.8°F | Ht 68.0 in | Wt 189.5 lb

## 2018-09-02 DIAGNOSIS — Z23 Encounter for immunization: Secondary | ICD-10-CM | POA: Diagnosis not present

## 2018-09-02 DIAGNOSIS — Z0000 Encounter for general adult medical examination without abnormal findings: Secondary | ICD-10-CM

## 2018-12-02 ENCOUNTER — Other Ambulatory Visit: Payer: Self-pay | Admitting: Family Medicine

## 2018-12-02 MED ORDER — LOSARTAN POTASSIUM 50 MG PO TABS: 50.0000 mg | ORAL_TABLET | Freq: Every day | ORAL | 1 refills | Status: DC

## 2018-12-02 NOTE — Addendum Note (Signed)
Addended by: Carter Kitten on: 12/02/2018 11:36 AM   Modules accepted: Orders

## 2018-12-29 ENCOUNTER — Other Ambulatory Visit: Payer: Self-pay | Admitting: Family Medicine

## 2019-02-25 ENCOUNTER — Other Ambulatory Visit: Payer: Self-pay | Admitting: Family Medicine

## 2019-05-04 ENCOUNTER — Other Ambulatory Visit: Payer: Self-pay

## 2019-05-04 ENCOUNTER — Ambulatory Visit (INDEPENDENT_AMBULATORY_CARE_PROVIDER_SITE_OTHER): Payer: Managed Care, Other (non HMO) | Admitting: Family Medicine

## 2019-05-04 ENCOUNTER — Encounter: Payer: Self-pay | Admitting: Family Medicine

## 2019-05-04 VITALS — BP 130/76 | HR 83 | Temp 99.1°F | Ht 68.0 in | Wt 194.0 lb

## 2019-05-04 DIAGNOSIS — M222X2 Patellofemoral disorders, left knee: Secondary | ICD-10-CM

## 2019-05-04 DIAGNOSIS — N451 Epididymitis: Secondary | ICD-10-CM | POA: Diagnosis not present

## 2019-05-04 MED ORDER — LEVOFLOXACIN 500 MG PO TABS: 500.0000 mg | ORAL_TABLET | Freq: Every day | ORAL | 0 refills | Status: AC

## 2019-05-04 NOTE — Progress Notes (Signed)
Marixa Mellott T. Chosen Garron, MD Primary Care and Sports Medicine [facility] [address] [place], [address] Phone: [phone]  FAX: [phone]  Roger Brown - 38 y.o. male  MRN [id number]  Date of Birth: 1981-06-13  Visit Date: 05/04/2019  PCP: Hannah Beat, MD  Referred by: Hannah Beat, MD  Chief Complaint  Patient presents with  . Testicle Pain    x2 weeks  . Knee Pain    Left knee x3 days   Subjective:   BENTLEE BENNINGFIELD is a 38 y.o. very pleasant male patient who presents with the following:  Testicle pain, 2 weeks.  This is been ongoing for about 2 weeks.  He has not had any trauma, known STD exposures, he has been monogamous with his wife for many years.  He is not having any penile discharge, ulcers, pain with urination.  He primarily has pain in the posterior aspect of his testicle.  Dx at [age] years ago, had and went to the ER, had multiple issues with his testicles.  Urologist, held off and called the urologist.  Epi in the past.  Pain went away. Now having some dull achy pain in the left side.  Pain in the testicle. Dull and achy.  Worst with laying down.   Also with some left knee pain.  Off and on for years.  Played fb no injury.  He is having anterior knee pain.  Recently he played a football game with his child, and now is having some more acute sided left knee pain, this is only been ongoing for about 3 days.  Epidy - abx    Past Medical History, Surgical History, Social History, Family History, Problem List, Medications, and Allergies have been reviewed and updated if relevant.  Patient Active Problem List   Diagnosis Date Noted  . Hyperlipidemia 01/08/2016  . Polyarthralgia 12/22/2014  . Gout 07/01/2013  . Migraine 07/01/2013  . Essential hypertension 07/01/2013  . Narcolepsy 07/01/2013  . Obesity (BMI 30-39.9) 06/30/2013    Past Medical History:  Diagnosis Date  . Gout 07/01/2013  . Herniated  disc, cervical   . Hydrocele   . Hyperlipidemia 01/08/2016  . Hypertension   . Migraine 07/01/2013  . Narcolepsy     Past Surgical History:  Procedure Laterality Date  . COSMETIC SURGERY    . TONSILLECTOMY AND ADENOIDECTOMY      Social History   Socioeconomic History  . Marital status: Married    Spouse name: Not on file  . Number of children: Not on file  . Years of education: Not on file  . Highest education level: Not on file  Occupational History  . 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 Financial resource strain: Not on file  . Food insecurity    Worry: Not on file    Inability: Not on file  . Transportation needs    Medical: Not on file    Non-medical: Not on file  Tobacco Use  . Smoking status: Former Smoker    Types: Cigarettes  . Smokeless tobacco: Never Used  Substance and Sexual Activity  . Alcohol use: No    Alcohol/week: 0.0 standard drinks  . Drug use: No  . Sexual activity: Yes    Partners: Female  Lifestyle  . Physical activity    Days per week: Not on file    Minutes per session:  Not on file  . Stress: Not on file  Relationships  . Social Musicianconnections    Talks on phone: Not on file    Gets together: Not on file    Attends religious service: Not on file    Active member of club or organization: Not on file    Attends meetings of clubs or organizations: Not on file    Relationship status: Not on file  . Intimate partner violence    Fear of current or ex partner: Not on file    Emotionally abused: Not on file    Physically abused: Not on file    Forced sexual activity: Not on file  Other Topics Concern  . Not on file  Social History Narrative   Former patient of Dr. Prince RomeHilts          Family History  Problem Relation Age of Onset  . Arthritis Mother   . Arthritis Father   . Diabetes Father   . Lupus Father   . Gout Father   . Arthritis Maternal Grandmother   . Diabetes  Maternal Grandmother   . Arthritis Maternal Grandfather   . Gout Maternal Grandfather   . Arthritis Paternal Grandmother   . Arthritis Paternal Grandfather   . Gout Brother   . Gout Brother     Allergies  Allergen Reactions  . Amoxicillin Hives and Swelling    Medication list reviewed and updated in full in [facility].   GEN: No acute illnesses, no fevers, chills. GI: No n/v/d, eating normally Pulm: No SOB Interactive and getting along well at home.  Otherwise, ROS is as per the HPI.  Objective:   BP 130/76   Pulse 83   Temp 99.1 F (37.3 C) (Tympanic)   Ht 5\' 8"  (1.727 m)   Wt 194 lb (88 kg)   SpO2 97%   BMI 29.50 kg/m   GEN: WDWN, NAD, Non-toxic, A & O x 3 HEENT: Atraumatic, Normocephalic. Neck supple. No masses, No LAD. Ears and Nose: No external deformity. CV: RRR, No M/G/R. No JVD. No thrill. No extra heart sounds. PULM: CTA B, no wheezes, crackles, rhonchi. No retractions. No resp. distress. No accessory muscle use. EXTR: No c/c/e NEURO Normal gait.  PSYCH: Normally interactive. Conversant. Not depressed or anxious appearing.  Calm demeanor.   Left-sided posterior testicular pain on exam.  The rest of the genitourinary exam is completely normal.  Left knee has extension to 0 and flexion to approximately 135.  Does have some pain at the medial and lateral patellar facets.  All ligaments are intact.  Strength is 5/5, neurovascularly intact.  Laboratory and Imaging Data:  Assessment and Plan:     ICD-10-CM   1. Epididymitis  N45.1   2. Patellofemoral syndrome of left knee  M22.2X2    Fluoroquinolone.  Reviewed potential tendon issues.  Classic patellofemoral syndrome.  Work on weight loss, quadricep and hip strength as well as hamstring flexibility.  Follow-up: No follow-ups on file.  Meds ordered this encounter  Medications  . levofloxacin (LEVAQUIN) 500 MG tablet    Sig: Take 1 tablet (500 mg total) by mouth daily for 10 days.    Dispense:   10 tablet    Refill:  0   No orders of the defined types were placed in this encounter.   Signed,  Elpidio GaleaSpencer T. Rachid Parham, MD   Outpatient Encounter Medications as of 05/04/2019  Medication Sig  . albuterol (PROVENTIL HFA;VENTOLIN HFA) 108 (90 Base) MCG/ACT inhaler  Inhale 2 puffs into the lungs every 6 (six) hours as needed for wheezing or shortness of breath.  . allopurinol (ZYLOPRIM) 100 MG tablet TAKE 2 TABLETS BY MOUTH ONCE A DAY  . Armodafinil 250 MG tablet Take 250 mg by mouth every morning.  . divalproex (DEPAKOTE) 500 MG DR tablet Take 500 mg in the morning and 1000 mg at bedtime.  Marland Kitchen eletriptan (RELPAX) 40 MG tablet Take 40 mg by mouth as needed for migraine or headache. One tablet by mouth at onset of headache. May repeat in 2 hours if headache persists or recurs.  Marland Kitchen EMGALITY 120 MG/ML SOAJ   . hydrochlorothiazide (HYDRODIURIL) 25 MG tablet TAKE 1 TABLET BY MOUTH ONCE A DAY  . indomethacin (INDOCIN) 50 MG capsule Take 1 capsule (50 mg total) by mouth 3 (three) times daily with meals.  Marland Kitchen losartan (COZAAR) 50 MG tablet Take 1 tablet (50 mg total) by mouth daily.  . methylphenidate (RITALIN) 10 MG tablet Take 10 mg by mouth 2 (two) times daily.  . vitamin C (ASCORBIC ACID) 500 MG tablet Take 500 mg by mouth 2 (two) times daily. Reported on 12/07/2015  . ALPRAZolam (XANAX) 0.5 MG tablet Take 0.5 mg by mouth as needed for anxiety.  Marland Kitchen levofloxacin (LEVAQUIN) 500 MG tablet Take 1 tablet (500 mg total) by mouth daily for 10 days.  . ondansetron (ZOFRAN) 4 MG tablet Take 1 tablet (4 mg total) by mouth every 8 (eight) hours as needed for nausea or vomiting. (Patient not taking: Reported on 05/04/2019)  . [DISCONTINUED] colchicine (COLCRYS) 0.6 MG tablet Take 1 tablet (0.6 mg total) by mouth 2 (two) times daily.   No facility-administered encounter medications on file as of 05/04/2019.

## 2019-05-21 ENCOUNTER — Other Ambulatory Visit: Payer: Self-pay | Admitting: Family Medicine

## 2019-08-30 ENCOUNTER — Other Ambulatory Visit: Payer: Self-pay | Admitting: Family Medicine

## 2019-08-30 DIAGNOSIS — E785 Hyperlipidemia, unspecified: Secondary | ICD-10-CM

## 2019-08-30 DIAGNOSIS — Z131 Encounter for screening for diabetes mellitus: Secondary | ICD-10-CM

## 2019-08-30 DIAGNOSIS — Z0000 Encounter for general adult medical examination without abnormal findings: Secondary | ICD-10-CM

## 2019-09-09 ENCOUNTER — Other Ambulatory Visit: Payer: Managed Care, Other (non HMO)

## 2019-09-14 ENCOUNTER — Encounter: Payer: Managed Care, Other (non HMO) | Admitting: Family Medicine

## 2019-09-16 ENCOUNTER — Other Ambulatory Visit: Payer: Self-pay | Admitting: Family Medicine

## 2019-10-07 ENCOUNTER — Other Ambulatory Visit (INDEPENDENT_AMBULATORY_CARE_PROVIDER_SITE_OTHER): Payer: Managed Care, Other (non HMO)

## 2019-10-07 ENCOUNTER — Other Ambulatory Visit: Payer: Self-pay

## 2019-10-07 DIAGNOSIS — Z0000 Encounter for general adult medical examination without abnormal findings: Secondary | ICD-10-CM | POA: Diagnosis not present

## 2019-10-07 DIAGNOSIS — E785 Hyperlipidemia, unspecified: Secondary | ICD-10-CM

## 2019-10-07 DIAGNOSIS — Z131 Encounter for screening for diabetes mellitus: Secondary | ICD-10-CM | POA: Diagnosis not present

## 2019-10-07 LAB — LIPID PANEL
Cholesterol: 226 mg/dL — ABNORMAL HIGH (ref 0–200)
HDL: 50.6 mg/dL (ref >39.00)
LDL Cholesterol: 160 mg/dL — ABNORMAL HIGH (ref 0–99)
NonHDL: 175.73
Total CHOL/HDL Ratio: 4
Triglycerides: 81 mg/dL (ref 0.0–149.0)
VLDL: 16.2 mg/dL (ref 0.0–40.0)

## 2019-10-07 LAB — CBC WITH DIFFERENTIAL/PLATELET
Basophils Absolute: 0 10*3/uL (ref 0.0–0.1)
Basophils Relative: 0.5 % (ref 0.0–3.0)
Eosinophils Absolute: 0.1 10*3/uL (ref 0.0–0.7)
Eosinophils Relative: 2.1 % (ref 0.0–5.0)
HCT: 42.8 % (ref 39.0–52.0)
Hemoglobin: 14.7 g/dL (ref 13.0–17.0)
Lymphocytes Relative: 50.9 % — ABNORMAL HIGH (ref 12.0–46.0)
Lymphs Abs: 2.4 10*3/uL (ref 0.7–4.0)
MCHC: 34.4 g/dL (ref 30.0–36.0)
MCV: 87.3 fl (ref 78.0–100.0)
Monocytes Absolute: 0.3 10*3/uL (ref 0.1–1.0)
Monocytes Relative: 5.9 % (ref 3.0–12.0)
Neutro Abs: 2 10*3/uL (ref 1.4–7.7)
Neutrophils Relative %: 40.6 % — ABNORMAL LOW (ref 43.0–77.0)
Platelets: 252 10*3/uL (ref 150.0–400.0)
RBC: 4.9 Mil/uL (ref 4.22–5.81)
RDW: 13.2 % (ref 11.5–15.5)
WBC: 4.8 10*3/uL (ref 4.0–10.5)

## 2019-10-07 LAB — BASIC METABOLIC PANEL
BUN: 17 mg/dL (ref 6–23)
CO2: 29 mEq/L (ref 19–32)
Calcium: 9.8 mg/dL (ref 8.4–10.5)
Chloride: 102 mEq/L (ref 96–112)
Creatinine, Ser: 0.91 mg/dL (ref 0.40–1.50)
GFR: 93.4 mL/min (ref >60.00)
Glucose, Bld: 103 mg/dL — ABNORMAL HIGH (ref 70–99)
Potassium: 3.9 mEq/L (ref 3.5–5.1)
Sodium: 140 mEq/L (ref 135–145)

## 2019-10-07 LAB — HEPATIC FUNCTION PANEL
ALT: 16 U/L (ref 0–53)
AST: 13 U/L (ref 0–37)
Albumin: 4.8 g/dL (ref 3.5–5.2)
Alkaline Phosphatase: 72 U/L (ref 39–117)
Bilirubin, Direct: 0.1 mg/dL (ref 0.0–0.3)
Total Bilirubin: 0.4 mg/dL (ref 0.2–1.2)
Total Protein: 7.3 g/dL (ref 6.0–8.3)

## 2019-10-07 LAB — HEMOGLOBIN A1C: Hgb A1c MFr Bld: 5.2 % (ref 4.6–6.5)

## 2019-10-12 ENCOUNTER — Encounter: Payer: Managed Care, Other (non HMO) | Admitting: Family Medicine

## 2019-10-24 ENCOUNTER — Encounter: Payer: Managed Care, Other (non HMO) | Admitting: Family Medicine

## 2020-01-06 ENCOUNTER — Other Ambulatory Visit: Payer: Self-pay | Admitting: Family Medicine

## 2020-02-17 ENCOUNTER — Other Ambulatory Visit: Payer: Self-pay | Admitting: Family Medicine

## 2020-02-17 NOTE — Telephone Encounter (Signed)
Last office visit 05/04/2019 for testicle pain & knee pain.  Last CPE 09/02/2018.  No future appointments.  Ok to refill?

## 2020-05-25 ENCOUNTER — Other Ambulatory Visit: Payer: Self-pay | Admitting: Family Medicine

## 2020-05-25 NOTE — Telephone Encounter (Signed)
Last office visit 05/04/2019 for testicle and knee pain.  Last CPE 09/02/2018.  No future appointments.  Cancelled CPE scheduled for 10/24/2019.  Refill?

## 2020-09-24 ENCOUNTER — Other Ambulatory Visit: Payer: Self-pay | Admitting: Family Medicine

## 2020-09-24 NOTE — Telephone Encounter (Signed)
Left voice message for patient to call the office  

## 2020-09-24 NOTE — Telephone Encounter (Signed)
Please schedule CPE with fasting labs prior with Dr. Patsy Lager.  Once scheduled, send back to me to refills his meds.

## 2020-10-01 ENCOUNTER — Telehealth: Payer: Self-pay | Admitting: Family Medicine

## 2020-10-01 NOTE — Telephone Encounter (Signed)
Spoke with patient scheduled CPE with fasting labs 

## 2020-10-01 NOTE — Telephone Encounter (Signed)
There is no message attached to this phone note.

## 2020-10-18 ENCOUNTER — Other Ambulatory Visit (INDEPENDENT_AMBULATORY_CARE_PROVIDER_SITE_OTHER): Payer: Managed Care, Other (non HMO)

## 2020-10-18 ENCOUNTER — Other Ambulatory Visit: Payer: Self-pay

## 2020-10-18 ENCOUNTER — Telehealth: Payer: Self-pay | Admitting: Family Medicine

## 2020-10-18 DIAGNOSIS — Z125 Encounter for screening for malignant neoplasm of prostate: Secondary | ICD-10-CM | POA: Diagnosis not present

## 2020-10-18 DIAGNOSIS — Z79899 Other long term (current) drug therapy: Secondary | ICD-10-CM | POA: Diagnosis not present

## 2020-10-18 DIAGNOSIS — Z13220 Encounter for screening for lipoid disorders: Secondary | ICD-10-CM

## 2020-10-18 DIAGNOSIS — Z131 Encounter for screening for diabetes mellitus: Secondary | ICD-10-CM | POA: Diagnosis not present

## 2020-10-18 LAB — BASIC METABOLIC PANEL
BUN: 13 mg/dL (ref 6–23)
CO2: 30 mEq/L (ref 19–32)
Calcium: 10.1 mg/dL (ref 8.4–10.5)
Chloride: 105 mEq/L (ref 96–112)
Creatinine, Ser: 0.88 mg/dL (ref 0.40–1.50)
GFR: 108.93 mL/min (ref >60.00)
Glucose, Bld: 101 mg/dL — ABNORMAL HIGH (ref 70–99)
Potassium: 4.2 mEq/L (ref 3.5–5.1)
Sodium: 142 mEq/L (ref 135–145)

## 2020-10-18 LAB — CBC WITH DIFFERENTIAL/PLATELET
Basophils Absolute: 0 10*3/uL (ref 0.0–0.1)
Basophils Relative: 0.5 % (ref 0.0–3.0)
Eosinophils Absolute: 0.1 10*3/uL (ref 0.0–0.7)
Eosinophils Relative: 1.5 % (ref 0.0–5.0)
HCT: 44.4 % (ref 39.0–52.0)
Hemoglobin: 15.1 g/dL (ref 13.0–17.0)
Lymphocytes Relative: 45.2 % (ref 12.0–46.0)
Lymphs Abs: 2.4 10*3/uL (ref 0.7–4.0)
MCHC: 33.9 g/dL (ref 30.0–36.0)
MCV: 84.7 fl (ref 78.0–100.0)
Monocytes Absolute: 0.4 10*3/uL (ref 0.1–1.0)
Monocytes Relative: 6.8 % (ref 3.0–12.0)
Neutro Abs: 2.4 10*3/uL (ref 1.4–7.7)
Neutrophils Relative %: 46 % (ref 43.0–77.0)
Platelets: 251 10*3/uL (ref 150.0–400.0)
RBC: 5.24 Mil/uL (ref 4.22–5.81)
RDW: 13.5 % (ref 11.5–15.5)
WBC: 5.2 10*3/uL (ref 4.0–10.5)

## 2020-10-18 LAB — LIPID PANEL
Cholesterol: 199 mg/dL (ref 0–200)
HDL: 40.7 mg/dL (ref >39.00)
LDL Cholesterol: 135 mg/dL — ABNORMAL HIGH (ref 0–99)
NonHDL: 158.67
Total CHOL/HDL Ratio: 5
Triglycerides: 118 mg/dL (ref 0.0–149.0)
VLDL: 23.6 mg/dL (ref 0.0–40.0)

## 2020-10-18 LAB — HEPATIC FUNCTION PANEL
ALT: 16 U/L (ref 0–53)
AST: 12 U/L (ref 0–37)
Albumin: 4.8 g/dL (ref 3.5–5.2)
Alkaline Phosphatase: 68 U/L (ref 39–117)
Bilirubin, Direct: 0.1 mg/dL (ref 0.0–0.3)
Total Bilirubin: 0.4 mg/dL (ref 0.2–1.2)
Total Protein: 7.4 g/dL (ref 6.0–8.3)

## 2020-10-18 LAB — PSA: PSA: 0.22 ng/mL (ref 0.10–4.00)

## 2020-10-18 LAB — HEMOGLOBIN A1C: Hgb A1c MFr Bld: 5.3 % (ref 4.6–6.5)

## 2020-10-18 NOTE — Telephone Encounter (Signed)
For CPX:  FLP, Z13.220 (screening for high cholesterol) Cbc with diff, HFP, BMET: Z79.899 long-term medication usage Hemoglobin a1c: screening for diabetes PSA: Total with reflex to free: Z12.5 screening for prostate cancer

## 2020-10-24 ENCOUNTER — Encounter: Payer: Self-pay | Admitting: Family Medicine

## 2020-10-24 NOTE — Progress Notes (Signed)
Ivalee Strauser T. Thornton Dohrmann, MD, CAQ Sports Medicine  Primary Care and Sports Medicine [facility] [address] [place], [address]  Phone: [phone]  FAX: [phone]  Roger Brown - 40 y.o. male  MRN [id number]  Date of Birth: 1980-12-03  Date: 10/25/2020  PCP: Hannah Beat, MD  Referral: Hannah Beat, MD  Chief Complaint  Patient presents with  . Annual Exam    This visit occurred during the SARS-CoV-2 public health emergency.  Safety protocols were in place, including screening questions prior to the visit, additional usage of staff PPE, and extensive cleaning of exam room while observing appropriate contact time as indicated for disinfecting solutions.   Patient Care Team: Hannah Beat, MD as PCP - General (Family Medicine) Subjective:   Roger Brown is a 40 y.o. pleasant patient who presents with the following:  Preventative Health Maintenance Visit:  Health Maintenance Summary Reviewed and updated, unless pt declines services.  Tobacco History Reviewed. Alcohol: No concerns, no excessive use Exercise Habits: Not exercising all that much right now. STD concerns: no risk or activity to increase risk Drug Use: None  Wt Readings from Last 3 Encounters:  10/25/20 192 lb (87.1 kg)  05/04/19 194 lb (88 kg)  09/02/18 189 lb 8 oz (86 kg)    Continues to keep his weight off  Health Maintenance  Topic Date Due  . HIV Screening  Never done  . Hepatitis C Screening  Never done  . COVID-19 Vaccine (2 - Booster for [employer] series) 03/28/2020  . INFLUENZA VACCINE  01/21/2021  . TETANUS/TDAP  09/01/2028  . HPV VACCINES  Aged Out   He is UTD on his Covid vaccine.  Immunization History  Administered Date(s) Administered  . Influenza,inj,Quad PF,6+ Mos 03/28/2015, 04/23/2017  . Influenza-Unspecified 03/23/2018  . Janssen (J&J) SARS-COV-2 Vaccination 02/01/2020  . Tdap 09/02/2018   Patient Active Problem List    Diagnosis Date Noted  . Hyperlipidemia 01/08/2016  . Gout 07/01/2013  . History of migraine 07/01/2013  . Essential hypertension 07/01/2013  . 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 Gout 07/01/2013  . History of migraine 07/01/2013  . Hydrocele   . Hyperlipidemia 01/08/2016  . Hypertension   . Narcolepsy     Past Surgical History:  Procedure Laterality Date  . COSMETIC SURGERY    . TONSILLECTOMY AND ADENOIDECTOMY      Family History  Problem Relation Age of Onset  . Arthritis Mother   . Arthritis Father   . Diabetes Father   . Lupus Father   . Gout Father   . Arthritis Maternal Grandmother   . Diabetes Maternal Grandmother   . Arthritis Maternal Grandfather   . Gout Maternal Grandfather   . Arthritis Paternal Grandmother   . Arthritis Paternal Grandfather   . Gout Brother   . Gout Brother     Past Medical History, Surgical History, Social History, Family History, Problem List, Medications, and Allergies have been reviewed and updated if relevant.  Review of Systems: Pertinent positives are listed above.  Otherwise, a full 14 point review of systems has been done in full and it is negative except where it is noted positive.  Objective:   BP 138/86   Pulse 65   Temp 98.2 F (36.8 C) (Temporal)   Ht 5' 8.25" (1.734 m)   Wt 192 lb (87.1 kg)   SpO2 97%   BMI 28.98 kg/m  Ideal Body Weight:  Weight in (lb) to have BMI = 25: 165.3  Ideal Body Weight: Weight in (lb) to have BMI = 25: 165.3 No exam data present Depression screen [facility] 2/9 10/25/2020 09/02/2018 07/09/2017  Decreased Interest 0 0 0  Down, Depressed, Hopeless 0 0 0  PHQ - 2 Score 0 0 0  Altered sleeping 0 - -  Tired, decreased energy 1 - -  Change in appetite 0 - -  Feeling bad or failure about yourself  0 - -  Trouble concentrating 0 - -  Moving slowly or fidgety/restless 0 - -  Suicidal thoughts 0 - -  PHQ-9 Score 1 - -  Difficult doing work/chores Not difficult at all - -      GEN: well developed, well nourished, no acute distress Eyes: conjunctiva and lids normal, PERRLA, EOMI ENT: TM clear, nares clear, oral exam WNL Neck: supple, no lymphadenopathy, no thyromegaly, no JVD Pulm: clear to auscultation and percussion, respiratory effort normal CV: regular rate and rhythm, S1-S2, no murmur, rub or gallop, no bruits, peripheral pulses normal and symmetric, no cyanosis, clubbing, edema or varicosities GI: soft, non-tender; no hepatosplenomegaly, masses; active bowel sounds all quadrants GU: deferred Lymph: no cervical, axillary or inguinal adenopathy MSK: gait normal, muscle tone and strength WNL, no joint swelling, effusions, discoloration, crepitus  SKIN: clear, good turgor, color WNL, no rashes, lesions, or ulcerations Neuro: normal mental status, normal strength, sensation, and motion Psych: alert; oriented to person, place and time, normally interactive and not anxious or depressed in appearance.  All labs reviewed with patient. Results for orders placed or performed in visit on 10/18/20  Lipid panel  Result Value Ref Range   Cholesterol 199 0 - 200 mg/dL   Triglycerides 485.4 0.0 - 149.0 mg/dL   HDL 62.70 >35.00 mg/dL   VLDL 93.8 0.0 - 18.2 mg/dL   LDL Cholesterol 993 (H) 0 - 99 mg/dL   Total CHOL/HDL Ratio 5    NonHDL 158.67   Hepatic function panel  Result Value Ref Range   Total Bilirubin 0.4 0.2 - 1.2 mg/dL   Bilirubin, Direct 0.1 0.0 - 0.3 mg/dL   Alkaline Phosphatase 68 39 - 117 U/L   AST 12 0 - 37 U/L   ALT 16 0 - 53 U/L   Total Protein 7.4 6.0 - 8.3 g/dL   Albumin 4.8 3.5 - 5.2 g/dL  Basic metabolic panel  Result Value Ref Range   Sodium 142 135 - 145 mEq/L   Potassium 4.2 3.5 - 5.1 mEq/L   Chloride 105 96 - 112 mEq/L   CO2 30 19 - 32 mEq/L   Glucose, Bld 101 (H) 70 - 99 mg/dL   BUN 13 6 - 23 mg/dL   Creatinine, Ser 7.16 0.40 - 1.50 mg/dL   GFR 967.89 >38.10 mL/min   Calcium 10.1 8.4 - 10.5 mg/dL  CBC with  Differential/Platelet  Result Value Ref Range   WBC 5.2 4.0 - 10.5 K/uL   RBC 5.24 4.22 - 5.81 Mil/uL   Hemoglobin 15.1 13.0 - 17.0 g/dL   HCT 17.5 10.2 - 58.5 %   MCV 84.7 78.0 - 100.0 fl   MCHC 33.9 30.0 - 36.0 g/dL   RDW 27.7 82.4 - 23.5 %   Platelets 251.0 150.0 - 400.0 K/uL   Neutrophils Relative % 46.0 43.0 - 77.0 %   Lymphocytes Relative 45.2 12.0 - 46.0 %   Monocytes Relative 6.8 3.0 - 12.0 %   Eosinophils Relative 1.5 0.0 - 5.0 %  Basophils Relative 0.5 0.0 - 3.0 %   Neutro Abs 2.4 1.4 - 7.7 K/uL   Lymphs Abs 2.4 0.7 - 4.0 K/uL   Monocytes Absolute 0.4 0.1 - 1.0 K/uL   Eosinophils Absolute 0.1 0.0 - 0.7 K/uL   Basophils Absolute 0.0 0.0 - 0.1 K/uL  Hemoglobin A1c  Result Value Ref Range   Hgb A1c MFr Bld 5.3 4.6 - 6.5 %  PSA  Result Value Ref Range   PSA 0.22 0.10 - 4.00 ng/mL    Assessment and Plan:     ICD-10-CM   1. Healthcare maintenance  Z00.00    Overall he is doing well.  I encouraged him to work on his fitness level, and I congratulated him on maintaining his weight loss.  Health Maintenance Exam: The patient's preventative maintenance and recommended screening tests for an annual wellness exam were reviewed in full today. Brought up to date unless services declined.  Counselled on the importance of diet, exercise, and its role in overall health and mortality. The patient's FH and SH was reviewed, including their home life, tobacco status, and drug and alcohol status.  Follow-up in 1 year for physical exam or additional follow-up below.  Follow-up: No follow-ups on file. Or follow-up in 1 year if not noted.   Signed,  Roger Galea. Janet Decesare, MD   Allergies as of 10/25/2020      Reactions   Amoxicillin Hives, Swelling      Medication List       Accurate as of Oct 25, 2020 11:59 PM. If you have any questions, ask your nurse or doctor.        albuterol 108 (90 Base) MCG/ACT inhaler Commonly known as: VENTOLIN HFA Inhale 2 puffs into the lungs  every 6 (six) hours as needed for wheezing or shortness of breath.   allopurinol 100 MG tablet Commonly known as: ZYLOPRIM TAKE 2 TABLETS BY MOUTH ONCE A DAY   ALPRAZolam 0.5 MG tablet Commonly known as: XANAX Take 0.5 mg by mouth as needed for anxiety.   Armodafinil 250 MG tablet Take 250 mg by mouth every morning.   divalproex 500 MG DR tablet Commonly known as: DEPAKOTE Take 500 mg in the morning and 1000 mg at bedtime.   eletriptan 40 MG tablet Commonly known as: RELPAX Take 40 mg by mouth as needed for migraine or headache. One tablet by mouth at onset of headache. May repeat in 2 hours if headache persists or recurs.   Emgality 120 MG/ML Soaj Generic drug: Galcanezumab-gnlm   hydrochlorothiazide 25 MG tablet Commonly known as: HYDRODIURIL TAKE 1 TABLET BY MOUTH ONCE DAILY   indomethacin 50 MG capsule Commonly known as: INDOCIN Take 1 capsule (50 mg total) by mouth 3 (three) times daily with meals.   losartan 50 MG tablet Commonly known as: COZAAR TAKE 1 TABLET BY MOUTH ONCE DAILY   methylphenidate 10 MG tablet Commonly known as: RITALIN Take 10 mg by mouth 2 (two) times daily.   Nurtec 75 MG Tbdp Generic drug: Rimegepant Sulfate Take 1 tablet by mouth daily as needed.   ondansetron 4 MG tablet Commonly known as: Zofran Take 1 tablet (4 mg total) by mouth every 8 (eight) hours as needed for nausea or vomiting.   vitamin C 500 MG tablet Commonly known as: ASCORBIC ACID Take 500 mg by mouth 2 (two) times daily. Reported on 12/07/2015

## 2020-10-25 ENCOUNTER — Encounter: Payer: Self-pay | Admitting: Family Medicine

## 2020-10-25 ENCOUNTER — Ambulatory Visit (INDEPENDENT_AMBULATORY_CARE_PROVIDER_SITE_OTHER): Payer: Managed Care, Other (non HMO) | Admitting: Family Medicine

## 2020-10-25 ENCOUNTER — Other Ambulatory Visit: Payer: Self-pay

## 2020-10-25 VITALS — BP 138/86 | HR 65 | Temp 98.2°F | Ht 68.25 in | Wt 192.0 lb

## 2020-10-25 DIAGNOSIS — Z0000 Encounter for general adult medical examination without abnormal findings: Secondary | ICD-10-CM | POA: Diagnosis not present

## 2021-01-30 ENCOUNTER — Other Ambulatory Visit: Payer: Self-pay | Admitting: Family Medicine

## 2021-10-22 ENCOUNTER — Other Ambulatory Visit: Payer: Self-pay | Admitting: Family Medicine

## 2021-10-22 DIAGNOSIS — Z1159 Encounter for screening for other viral diseases: Secondary | ICD-10-CM

## 2021-10-22 DIAGNOSIS — Z114 Encounter for screening for human immunodeficiency virus [HIV]: Secondary | ICD-10-CM

## 2021-10-22 DIAGNOSIS — E785 Hyperlipidemia, unspecified: Secondary | ICD-10-CM

## 2021-10-22 DIAGNOSIS — Z131 Encounter for screening for diabetes mellitus: Secondary | ICD-10-CM

## 2021-10-22 DIAGNOSIS — Z79899 Other long term (current) drug therapy: Secondary | ICD-10-CM

## 2021-10-23 ENCOUNTER — Other Ambulatory Visit: Payer: Managed Care, Other (non HMO)

## 2021-10-30 ENCOUNTER — Encounter: Payer: Managed Care, Other (non HMO) | Admitting: Family Medicine

## 2021-11-15 ENCOUNTER — Other Ambulatory Visit (INDEPENDENT_AMBULATORY_CARE_PROVIDER_SITE_OTHER): Payer: Managed Care, Other (non HMO)

## 2021-11-15 DIAGNOSIS — Z131 Encounter for screening for diabetes mellitus: Secondary | ICD-10-CM | POA: Diagnosis not present

## 2021-11-15 DIAGNOSIS — Z79899 Other long term (current) drug therapy: Secondary | ICD-10-CM

## 2021-11-15 DIAGNOSIS — Z114 Encounter for screening for human immunodeficiency virus [HIV]: Secondary | ICD-10-CM

## 2021-11-15 DIAGNOSIS — Z1159 Encounter for screening for other viral diseases: Secondary | ICD-10-CM | POA: Diagnosis not present

## 2021-11-15 DIAGNOSIS — E785 Hyperlipidemia, unspecified: Secondary | ICD-10-CM | POA: Diagnosis not present

## 2021-11-15 LAB — CBC WITH DIFFERENTIAL/PLATELET
Basophils Absolute: 0 10*3/uL (ref 0.0–0.1)
Basophils Relative: 0.6 % (ref 0.0–3.0)
Eosinophils Absolute: 0.1 10*3/uL (ref 0.0–0.7)
Eosinophils Relative: 1.6 % (ref 0.0–5.0)
HCT: 41.1 % (ref 39.0–52.0)
Hemoglobin: 14.2 g/dL (ref 13.0–17.0)
Lymphocytes Relative: 49 % — ABNORMAL HIGH (ref 12.0–46.0)
Lymphs Abs: 2.3 10*3/uL (ref 0.7–4.0)
MCHC: 34.5 g/dL (ref 30.0–36.0)
MCV: 88.2 fl (ref 78.0–100.0)
Monocytes Absolute: 0.3 10*3/uL (ref 0.1–1.0)
Monocytes Relative: 6.7 % (ref 3.0–12.0)
Neutro Abs: 2 10*3/uL (ref 1.4–7.7)
Neutrophils Relative %: 42.1 % — ABNORMAL LOW (ref 43.0–77.0)
Platelets: 287 10*3/uL (ref 150.0–400.0)
RBC: 4.66 Mil/uL (ref 4.22–5.81)
RDW: 13 % (ref 11.5–15.5)
WBC: 4.7 10*3/uL (ref 4.0–10.5)

## 2021-11-15 LAB — BASIC METABOLIC PANEL
BUN: 11 mg/dL (ref 6–23)
CO2: 29 mEq/L (ref 19–32)
Calcium: 10.1 mg/dL (ref 8.4–10.5)
Chloride: 103 mEq/L (ref 96–112)
Creatinine, Ser: 0.91 mg/dL (ref 0.40–1.50)
GFR: 105.96 mL/min (ref >60.00)
Glucose, Bld: 88 mg/dL (ref 70–99)
Potassium: 4.4 mEq/L (ref 3.5–5.1)
Sodium: 141 mEq/L (ref 135–145)

## 2021-11-15 LAB — LIPID PANEL
Cholesterol: 220 mg/dL — ABNORMAL HIGH (ref 0–200)
HDL: 42.7 mg/dL (ref >39.00)
LDL Cholesterol: 150 mg/dL — ABNORMAL HIGH (ref 0–99)
NonHDL: 177.51
Total CHOL/HDL Ratio: 5
Triglycerides: 136 mg/dL (ref 0.0–149.0)
VLDL: 27.2 mg/dL (ref 0.0–40.0)

## 2021-11-15 LAB — HEPATIC FUNCTION PANEL
ALT: 15 U/L (ref 0–53)
AST: 13 U/L (ref 0–37)
Albumin: 4.7 g/dL (ref 3.5–5.2)
Alkaline Phosphatase: 57 U/L (ref 39–117)
Bilirubin, Direct: 0.1 mg/dL (ref 0.0–0.3)
Total Bilirubin: 0.7 mg/dL (ref 0.2–1.2)
Total Protein: 7 g/dL (ref 6.0–8.3)

## 2021-11-15 LAB — HEMOGLOBIN A1C: Hgb A1c MFr Bld: 5 % (ref 4.6–6.5)

## 2021-11-19 LAB — HEPATITIS C ANTIBODY
Hepatitis C Ab: NONREACTIVE
SIGNAL TO CUT-OFF: 0.4 (ref <1.00)

## 2021-11-19 LAB — HIV ANTIBODY (ROUTINE TESTING W REFLEX): HIV 1&2 Ab, 4th Generation: NONREACTIVE

## 2021-11-20 ENCOUNTER — Encounter: Payer: Self-pay | Admitting: Family Medicine

## 2021-11-20 ENCOUNTER — Ambulatory Visit (INDEPENDENT_AMBULATORY_CARE_PROVIDER_SITE_OTHER): Payer: Managed Care, Other (non HMO) | Admitting: Family Medicine

## 2021-11-20 VITALS — BP 120/80 | HR 82 | Temp 98.6°F | Ht 68.25 in | Wt 178.2 lb

## 2021-11-20 DIAGNOSIS — Z0000 Encounter for general adult medical examination without abnormal findings: Secondary | ICD-10-CM | POA: Diagnosis not present

## 2021-11-20 NOTE — Progress Notes (Signed)
Roger Brown T. Roger Montoya, MD, CAQ Sports Medicine  [facility] [address] [place], [id number]  Phone: [phone]  FAX: [phone]  Roger HitchJoshua B Brown - 41 y.o. male  MRN [id number]  Date of Birth: [birth year]  Date: 11/20/2021  PCP: Roger Brown, Roger Coviello, MD  Referral: Roger Brown, Roger More, MD  Chief Complaint  Patient presents with   Annual Exam   Patient Care Team: Roger Brown, Roger Leavy, MD as PCP - General (Family Medicine) Subjective:   Roger Brown is a 41 y.o. pleasant patient who presents with the following:  Preventative Health Maintenance Visit:  Health Maintenance Summary Reviewed and updated, unless pt declines services.  Tobacco History Reviewed. Alcohol: None Exercise Habits: Some walking STD concerns: no risk or activity to increase risk Drug Use: None  Covid vacc -he is thinking about Flu - at work?  Lost 55 pounds.  Eating a ton of fruits, veggies, no fatty.  Migraines are under better control.  He is really feeling good, he does continue to have some back pain.  He is not doing any resistance training right now.  Migraines are under better control, and he is followed by neurology for these. He also continues to on some Ritalin and armodafinil with some narcolepsy  Health Maintenance  Topic Date Due   COVID-19 Vaccine (2 - Janssen risk series) 02/29/2020   INFLUENZA VACCINE  01/21/2022   TETANUS/TDAP  09/01/2028   Hepatitis C Screening  Completed   HIV Screening  Completed   HPV VACCINES  Aged Out   Immunization History  Administered Date(s) Administered   Influenza,inj,Quad PF,6+ Mos 03/28/2015, 04/23/2017   Influenza-Unspecified 03/23/2018   Janssen (J&J) SARS-COV-2 Vaccination 02/01/2020   Tdap 09/02/2018   Patient Active Problem List   Diagnosis Date Noted   Hyperlipidemia 01/08/2016   Gout 07/01/2013   History of migraine 07/01/2013   Essential hypertension 07/01/2013   Narcolepsy 07/01/2013    Past  Medical History:  Diagnosis Date   Gout 07/01/2013   History of migraine 07/01/2013   Hydrocele    Hyperlipidemia 01/08/2016   Hypertension    Narcolepsy     Past Surgical History:  Procedure Laterality Date   COSMETIC SURGERY     TONSILLECTOMY AND ADENOIDECTOMY      Family History  Problem Relation Age of Onset   Arthritis Mother    Arthritis Father    Diabetes Father    Lupus Father    Gout Father    Arthritis Maternal Grandmother    Diabetes Maternal Grandmother    Arthritis Maternal Grandfather    Gout Maternal Grandfather    Arthritis Paternal Grandmother    Arthritis Paternal Grandfather    Gout Brother    Gout Brother     Social History   Social History Narrative   Former patient of Dr. Prince RomeHilts          Past Medical History, Surgical History, Social History, Family History, Problem List, Medications, and Allergies have been reviewed and updated if relevant.  Review of Systems: Pertinent positives are listed above.  Otherwise, a full 14 point review of systems has been done in full and it is negative except where it is noted positive.  Objective:   BP 120/80   Pulse 82   Temp 98.6 F (37 C) (Oral)   Ht 5' 8.25" (1.734 m)   Wt 178 lb 4 oz (80.9 kg)   SpO2 97%   BMI 26.90 kg/m  Ideal Body Weight: Weight in (lb)  to have BMI = 25: 165.3  Ideal Body Weight: Weight in (lb) to have BMI = 25: 165.3 No results found.    11/20/2021    9:52 AM 10/25/2020    9:14 AM 09/02/2018   10:35 AM 07/09/2017    9:02 AM  Depression screen PHQ 2/9  Decreased Interest 0 0 0 0  Down, Depressed, Hopeless 0 0 0 0  PHQ - 2 Score 0 0 0 0  Altered sleeping  0    Tired, decreased energy  1    Change in appetite  0    Feeling bad or failure about yourself   0    Trouble concentrating  0    Moving slowly or fidgety/restless  0    Suicidal thoughts  0    PHQ-9 Score  1    Difficult doing work/chores  Not difficult at all       GEN: well developed, well nourished, no acute  distress Eyes: conjunctiva and lids normal, PERRLA, EOMI ENT: TM clear, nares clear, oral exam WNL Neck: supple, no lymphadenopathy, no thyromegaly, no JVD Pulm: clear to auscultation and percussion, respiratory effort normal CV: regular rate and rhythm, S1-S2, no murmur, rub or gallop, no bruits, peripheral pulses normal and symmetric, no cyanosis, clubbing, edema or varicosities GI: soft, non-tender; no hepatosplenomegaly, masses; active bowel sounds all quadrants GU: deferred Lymph: no cervical, axillary or inguinal adenopathy MSK: gait normal, muscle tone and strength WNL, no joint swelling, effusions, discoloration, crepitus  SKIN: clear, good turgor, color WNL, no rashes, lesions, or ulcerations Neuro: normal mental status, normal strength, sensation, and motion Psych: alert; oriented to person, place and time, normally interactive and not anxious or depressed in appearance.  All labs reviewed with patient. Results for orders placed or performed in visit on 11/15/21  Hepatitis C antibody  Result Value Ref Range   Hepatitis C Ab NON-REACTIVE NON-REACTIVE   SIGNAL TO CUT-OFF 0.40 <1.00  HIV Antibody (routine testing w rflx)  Result Value Ref Range   HIV 1&2 Ab, 4th Generation NON-REACTIVE NON-REACTIVE  Hemoglobin A1c  Result Value Ref Range   Hgb A1c MFr Bld 5.0 4.6 - 6.5 %  Basic metabolic panel  Result Value Ref Range   Sodium 141 135 - 145 mEq/L   Potassium 4.4 3.5 - 5.1 mEq/L   Chloride 103 96 - 112 mEq/L   CO2 29 19 - 32 mEq/L   Glucose, Bld 88 70 - 99 mg/dL   BUN 11 6 - 23 mg/dL   Creatinine, Ser 0.25 0.40 - 1.50 mg/dL   GFR 427.06 >23.76 mL/min   Calcium 10.1 8.4 - 10.5 mg/dL  CBC with Differential/Platelet  Result Value Ref Range   WBC 4.7 4.0 - 10.5 K/uL   RBC 4.66 4.22 - 5.81 Mil/uL   Hemoglobin 14.2 13.0 - 17.0 g/dL   HCT 28.3 15.1 - 76.1 %   MCV 88.2 78.0 - 100.0 fl   MCHC 34.5 30.0 - 36.0 g/dL   RDW 60.7 37.1 - 06.2 %   Platelets 287.0 150.0 - 400.0 K/uL    Neutrophils Relative % 42.1 (L) 43.0 - 77.0 %   Lymphocytes Relative 49.0 (H) 12.0 - 46.0 %   Monocytes Relative 6.7 3.0 - 12.0 %   Eosinophils Relative 1.6 0.0 - 5.0 %   Basophils Relative 0.6 0.0 - 3.0 %   Neutro Abs 2.0 1.4 - 7.7 K/uL   Lymphs Abs 2.3 0.7 - 4.0 K/uL   Monocytes Absolute 0.3 0.1 -  1.0 K/uL   Eosinophils Absolute 0.1 0.0 - 0.7 K/uL   Basophils Absolute 0.0 0.0 - 0.1 K/uL  Hepatic function panel  Result Value Ref Range   Total Bilirubin 0.7 0.2 - 1.2 mg/dL   Bilirubin, Direct 0.1 0.0 - 0.3 mg/dL   Alkaline Phosphatase 57 39 - 117 U/L   AST 13 0 - 37 U/L   ALT 15 0 - 53 U/L   Total Protein 7.0 6.0 - 8.3 g/dL   Albumin 4.7 3.5 - 5.2 g/dL  Lipid panel  Result Value Ref Range   Cholesterol 220 (H) 0 - 200 mg/dL   Triglycerides 161.0 0.0 - 149.0 mg/dL   HDL 96.04 >54.09 mg/dL   VLDL 81.1 0.0 - 91.4 mg/dL   LDL Cholesterol 782 (H) 0 - 99 mg/dL   Total CHOL/HDL Ratio 5    NonHDL 177.51     Assessment and Plan:     ICD-10-CM   1. Healthcare maintenance  Z00.00      He is really done well, remarkable 55 pound weight loss.  He has a phenomenal diet right now.  Think that if he does add some resistance training and increase his exercise capacity then he would likely drop his percent body fat even further.  I congratulated him on his continued vigilance.  Health Maintenance Exam: The patient's preventative maintenance and recommended screening tests for an annual wellness exam were reviewed in full today. Brought up to date unless services declined.  Counselled on the importance of diet, exercise, and its role in overall health and mortality. The patient's FH and SH was reviewed, including their home life, tobacco status, and drug and alcohol status.  Follow-up in 1 year for physical exam or additional follow-up below.  Follow-up: No follow-ups on file. Or follow-up in 1 year if not noted.  No orders of the defined types were placed in this  encounter.  Medications Discontinued During This Encounter  Medication Reason   methylphenidate (RITALIN) 10 MG tablet Change in therapy   allopurinol (ZYLOPRIM) 100 MG tablet Completed Course   indomethacin (INDOCIN) 50 MG capsule Completed Course   No orders of the defined types were placed in this encounter.   Signed,  Elpidio Galea. Aeron Lheureux, MD   Allergies as of 11/20/2021       Reactions   Amoxicillin Hives, Swelling        Medication List        Accurate as of Nov 20, 2021 10:25 AM. If you have any questions, ask your nurse or doctor.          STOP taking these medications    allopurinol 100 MG tablet Commonly known as: ZYLOPRIM Stopped by: Roger Beat, MD   indomethacin 50 MG capsule Commonly known as: INDOCIN Stopped by: Roger Beat, MD       TAKE these medications    albuterol 108 (90 Base) MCG/ACT inhaler Commonly known as: VENTOLIN HFA Inhale 2 puffs into the lungs every 6 (six) hours as needed for wheezing or shortness of breath.   ALPRAZolam 0.5 MG tablet Commonly known as: XANAX Take 0.5 mg by mouth as needed for anxiety.   Armodafinil 250 MG tablet Take 250 mg by mouth every morning.   divalproex 500 MG DR tablet Commonly known as: DEPAKOTE Take 500 mg in the morning and 1000 mg at bedtime.   eletriptan 40 MG tablet Commonly known as: RELPAX Take 40 mg by mouth as needed for migraine or headache. One tablet by  mouth at onset of headache. May repeat in 2 hours if headache persists or recurs.   Emgality 120 MG/ML Soaj Generic drug: Galcanezumab-gnlm   hydrochlorothiazide 25 MG tablet Commonly known as: HYDRODIURIL TAKE 1 TABLET BY MOUTH ONCE DAILY   losartan 50 MG tablet Commonly known as: COZAAR TAKE 1 TABLET BY MOUTH ONCE A DAY   methylphenidate 20 MG tablet Commonly known as: RITALIN Take 20 mg by mouth 2 (two) times daily. What changed: Another medication with the same name was removed. Continue taking this  medication, and follow the directions you see here. Changed by: Roger Beat, MD   multivitamin with minerals tablet Take 1 tablet by mouth daily.   Nurtec 75 MG Tbdp Generic drug: Rimegepant Sulfate Take 1 tablet by mouth daily as needed.   ondansetron 4 MG tablet Commonly known as: Zofran Take 1 tablet (4 mg total) by mouth every 8 (eight) hours as needed for nausea or vomiting.   vitamin C 500 MG tablet Commonly known as: ASCORBIC ACID Take 500 mg by mouth 2 (two) times daily. Reported on 12/07/2015

## 2022-03-03 ENCOUNTER — Ambulatory Visit (INDEPENDENT_AMBULATORY_CARE_PROVIDER_SITE_OTHER): Payer: Managed Care, Other (non HMO)

## 2022-03-03 ENCOUNTER — Ambulatory Visit (INDEPENDENT_AMBULATORY_CARE_PROVIDER_SITE_OTHER): Payer: Managed Care, Other (non HMO) | Admitting: Orthopaedic Surgery

## 2022-03-03 DIAGNOSIS — M5441 Lumbago with sciatica, right side: Secondary | ICD-10-CM | POA: Diagnosis not present

## 2022-03-03 DIAGNOSIS — M545 Low back pain, unspecified: Secondary | ICD-10-CM | POA: Diagnosis not present

## 2022-03-03 MED ORDER — PREDNISONE 50 MG PO TABS: ORAL_TABLET | ORAL | 0 refills | Status: DC

## 2022-03-03 MED ORDER — TIZANIDINE HCL 4 MG PO TABS: 4.0000 mg | ORAL_TABLET | Freq: Three times a day (TID) | ORAL | 0 refills | Status: AC | PRN

## 2022-03-03 MED ORDER — HYDROCODONE-ACETAMINOPHEN 5-325 MG PO TABS: 1.0000 | ORAL_TABLET | Freq: Four times a day (QID) | ORAL | 0 refills | Status: DC | PRN

## 2022-03-03 NOTE — Progress Notes (Signed)
Roger Brown comes in today with acute and significant low back pain that started within the last 24 to 48 hours.  He says is very painful to stand straight up and is having to walk hunched over.  He reports significant right-sided low back pain but radicular symptoms going down both legs more so on the right than the left.  He denies any specific injury.  He has reported some numbness and tingling as well.  He denies any change in bowel or bladder function.  He is a thin individual.  He is not a diabetic.  He is very uncomfortable appearing.  He has a positive straight leg raise bilaterally but good strength in his legs.  There is no numbness tingling in his feet today.  He is very limited with his flexion extension of the lumbar spine showing severe pain more to the right side.  There is pain to palpation as well along the right side of his low back and sciatic on the right side.  An AP and lateral lumbar spine show no acute findings.  The line is well-maintained.  I would like to start him on prednisone 50 mg daily for the next 5 days combined with Zanaflex and some hydrocodone.  He will rest his back.  I would like to see him back in just 1 week to make sure we are heading the right direction.  Right now he is not a good candidate for physical therapy given the severity of his pain and we need to get this under control before heading in the next direction.  He agrees with this treatment plan.  We will see him back in a week.  If things worsen dramatically he needs to let us know.

## 2022-03-12 ENCOUNTER — Other Ambulatory Visit: Payer: Self-pay | Admitting: Orthopaedic Surgery

## 2022-03-12 MED ORDER — PREDNISONE 50 MG PO TABS
ORAL_TABLET | ORAL | 0 refills | Status: AC
Start: 2022-03-12 — End: ?

## 2022-03-12 MED ORDER — HYDROCODONE-ACETAMINOPHEN 5-325 MG PO TABS: 1.0000 | ORAL_TABLET | Freq: Four times a day (QID) | ORAL | 0 refills | Status: DC | PRN

## 2022-03-13 ENCOUNTER — Ambulatory Visit: Payer: Managed Care, Other (non HMO) | Admitting: Orthopaedic Surgery

## 2022-04-17 ENCOUNTER — Other Ambulatory Visit: Payer: Self-pay | Admitting: Orthopaedic Surgery

## 2022-04-17 MED ORDER — HYDROCODONE-ACETAMINOPHEN 5-325 MG PO TABS: 1.0000 | ORAL_TABLET | Freq: Three times a day (TID) | ORAL | 0 refills | Status: DC | PRN

## 2022-04-18 ENCOUNTER — Other Ambulatory Visit: Payer: Self-pay

## 2022-04-18 DIAGNOSIS — M545 Low back pain, unspecified: Secondary | ICD-10-CM

## 2022-04-18 DIAGNOSIS — M5441 Lumbago with sciatica, right side: Secondary | ICD-10-CM

## 2022-04-21 ENCOUNTER — Telehealth: Payer: Self-pay

## 2022-04-21 NOTE — Telephone Encounter (Signed)
Despite having taken the medication given for the back he is no better. He is still having pain in his back, so we will order the MRI on his back

## 2022-05-02 ENCOUNTER — Ambulatory Visit
Admission: RE | Admit: 2022-05-02 | Discharge: 2022-05-02 | Disposition: A | Discharge status: Home / Self Care | Payer: Managed Care, Other (non HMO) | Source: Ambulatory Visit | Attending: Orthopaedic Surgery | Admitting: Orthopaedic Surgery

## 2022-05-02 DIAGNOSIS — M5441 Lumbago with sciatica, right side: Secondary | ICD-10-CM

## 2022-05-02 DIAGNOSIS — M545 Low back pain, unspecified: Secondary | ICD-10-CM

## 2022-05-05 ENCOUNTER — Telehealth: Payer: Self-pay | Admitting: Physical Medicine and Rehabilitation

## 2022-05-05 ENCOUNTER — Other Ambulatory Visit: Payer: Self-pay

## 2022-05-05 DIAGNOSIS — M545 Low back pain, unspecified: Secondary | ICD-10-CM

## 2022-05-05 DIAGNOSIS — M5416 Radiculopathy, lumbar region: Secondary | ICD-10-CM

## 2022-05-05 NOTE — Telephone Encounter (Signed)
I got a text from [facility] that Josh may need esi, I reviewed MRI. Luckily not much there but small left protrusion at L3-4. I am putting in for L4-5 interlaminar esi. Copying Dr. Magnus Ivan.

## 2022-05-12 ENCOUNTER — Ambulatory Visit: Payer: Self-pay

## 2022-05-12 ENCOUNTER — Ambulatory Visit: Payer: Managed Care, Other (non HMO) | Admitting: Physical Medicine and Rehabilitation

## 2022-05-12 VITALS — BP 171/105 | HR 74

## 2022-05-12 DIAGNOSIS — M5416 Radiculopathy, lumbar region: Secondary | ICD-10-CM

## 2022-05-12 MED ORDER — METHYLPREDNISOLONE ACETATE 80 MG/ML IJ SUSP
80.0000 mg | Freq: Once | INTRAMUSCULAR | Status: AC
  Administered 2022-05-12: 80 mg

## 2022-05-12 NOTE — Progress Notes (Signed)
Esi -L4-5 radiating on both sides of the buttucks.

## 2022-05-19 NOTE — Progress Notes (Signed)
AYAD NIEMAN - 41 y.o. male MRN [id number]  Date of birth: Aug 27, 1980  Office Visit Note: Visit Date: 05/12/2022 PCP: Hannah Beat, MD Referred by: Hannah Beat, MD  Subjective: Chief Complaint  Patient presents with   Lower Back - Pain   HPI:  TARREN VELARDI is a 41 y.o. male who comes in today at the request of Dr. Doneen Poisson for planned Midline L4-5 Lumbar Interlaminar epidural steroid injection with fluoroscopic guidance.  The patient has failed conservative care including home exercise, medications, time and activity modification.  This injection will be diagnostic and hopefully therapeutic.  Please see requesting physician notes for further details and justification. MRI reviewed with images and spine model.  MRI reviewed in the note below.    ROS Otherwise per HPI.  Assessment & Plan: Visit Diagnoses:    ICD-10-CM   1. Lumbar radiculopathy  M54.16 XR C-ARM NO REPORT    Epidural Steroid injection    methylPREDNISolone acetate (DEPO-MEDROL) injection 80 mg      Plan: No additional findings.   Meds & Orders:  Meds ordered this encounter  Medications   methylPREDNISolone acetate (DEPO-MEDROL) injection 80 mg    Orders Placed This Encounter  Procedures   XR C-ARM NO REPORT   Epidural Steroid injection    Follow-up: Return for visit to requesting provider as needed.   Procedures: No procedures performed  Lumbar Epidural Steroid Injection - Interlaminar Approach with Fluoroscopic Guidance  Patient: CHRISTIE COPLEY      Date of Birth: 1981-06-16 MRN: [id number] PCP: Hannah Beat, MD      Visit Date: 05/12/2022   Universal Protocol:     Consent Given By: the patient  Position: PRONE  Additional Comments: Vital signs were monitored before and after the procedure. Patient was prepped and draped in the usual sterile fashion. The correct patient, procedure, and site was verified.   Injection Procedure Details:   Procedure diagnoses:  Lumbar radiculopathy [M54.16]   Meds Administered:  Meds ordered this encounter  Medications   methylPREDNISolone acetate (DEPO-MEDROL) injection 80 mg     Laterality: Midline  Location/Site:  L4-5  Needle: 3.5 in., 20 ga. Tuohy  Needle Placement: Paramedian epidural  Findings:   -Comments: Excellent flow of contrast into the epidural space.  Procedure Details: Using a paramedian approach from the side mentioned above, the region overlying the inferior lamina was localized under fluoroscopic visualization and the soft tissues overlying this structure were infiltrated with 4 ml. of 1% Lidocaine without Epinephrine. The Tuohy needle was inserted into the epidural space using a paramedian approach.   The epidural space was localized using loss of resistance along with counter oblique bi-planar fluoroscopic views.  After negative aspirate for air, blood, and CSF, a 2 ml. volume of Isovue-250 was injected into the epidural space and the flow of contrast was observed. Radiographs were obtained for documentation purposes.    The injectate was administered into the level noted above.   Additional Comments:  The patient tolerated the procedure well Dressing: 2 x 2 sterile gauze and Band-Aid    Post-procedure details: Patient was observed during the procedure. Post-procedure instructions were reviewed.  Patient left the clinic in stable condition.   Clinical History: MRI LUMBAR SPINE WITHOUT CONTRAST   TECHNIQUE: Multiplanar, multisequence MR imaging of the lumbar spine was performed. No intravenous contrast was administered.   COMPARISON:  Lumbar spine x-rays dated March 03, 2022.   FINDINGS: Segmentation:  Standard.   Alignment:  Trace retrolisthesis  at L3-L4.   Vertebrae:  No fracture, evidence of discitis, or bone lesion.   Conus medullaris and cauda equina: Conus extends to the L1 level. Conus and cauda equina appear normal.   Paraspinal and other soft tissues:  Negative.   Disc levels:   T12-L1 to L2-L3:  Negative.   L3-L4: Mild disc bulging with superimposed left subarticular and foraminal disc protrusion. Left lateral recess stenosis with posterior displacement of the descending left L4 nerve root. Mild left neuroforaminal stenosis with encroachment on the exiting left L3 nerve root. No spinal canal or right neuroforaminal stenosis.   L4-L5: Mild disc bulging with superimposed small left foraminal disc protrusion and annular fissure. Mild bilateral facet arthropathy. Mild left neuroforaminal stenosis. No spinal canal or right neuroforaminal stenosis.   L5-S1: Negative disc. Mild bilateral facet arthropathy. No stenosis.   IMPRESSION: 1. 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 Left subarticular and foraminal disc protrusion at L3-L4 potentially affecting and irritating the descending left L4 and exiting left L3 nerve roots. 2. Small left foraminal disc protrusion at L4-L5 with mild left neuroforaminal stenosis. 3. No obvious right-sided impingement.     Electronically Signed   By: Obie Dredge M.D.   On: 05/05/2022 10:51     Objective:  VS:  HT:    WT:   BMI:     BP:(!) 171/105  HR:74bpm  TEMP: ( )  RESP:  Physical Exam Vitals and nursing note reviewed.  Constitutional:      General: He is not in acute distress.    Appearance: Normal appearance. He is not ill-appearing.  HENT:     Head: Normocephalic and atraumatic.     Right Ear: External ear normal.     Left Ear: External ear normal.     Nose: No congestion.  Eyes:     Extraocular Movements: Extraocular movements intact.  Cardiovascular:     Rate and Rhythm: Normal rate.     Pulses: Normal pulses.  Pulmonary:     Effort: Pulmonary effort is normal. No respiratory distress.  Abdominal:     General: There is no distension.     Palpations: Abdomen is soft.  Musculoskeletal:        General: No tenderness or signs of injury.      Cervical back: Neck supple.     Right lower leg: No edema.     Left lower leg: No edema.     Comments: Patient has good distal strength without clonus.  Skin:    Findings: No erythema or rash.  Neurological:     General: No focal deficit present.     Mental Status: He is alert and oriented to person, place, and time.     Sensory: No sensory deficit.     Motor: No weakness or abnormal muscle tone.     Coordination: Coordination normal.  Psychiatric:        Mood and Affect: Mood normal.        Behavior: Behavior normal.      Imaging: No results found.

## 2022-05-19 NOTE — Procedures (Signed)
Lumbar Epidural Steroid Injection - Interlaminar Approach with Fluoroscopic Guidance  Patient: Roger Brown      Date of Birth: [birth year] MRN: [id number] PCP: Hannah Beat, MD      Visit Date: 05/12/2022   Universal Protocol:     Consent Given By: the patient  Position: PRONE  Additional Comments: Vital signs were monitored before and after the procedure. Patient was prepped and draped in the usual sterile fashion. The correct patient, procedure, and site was verified.   Injection Procedure Details:   Procedure diagnoses: Lumbar radiculopathy [M54.16]   Meds Administered:  Meds ordered this encounter  Medications   methylPREDNISolone acetate (DEPO-MEDROL) injection 80 mg     Laterality: Midline  Location/Site:  L4-5  Needle: 3.5 in., 20 ga. Tuohy  Needle Placement: Paramedian epidural  Findings:   -Comments: Excellent flow of contrast into the epidural space.  Procedure Details: Using a paramedian approach from the side mentioned above, the region overlying the inferior lamina was localized under fluoroscopic visualization and the soft tissues overlying this structure were infiltrated with 4 ml. of 1% Lidocaine without Epinephrine. The Tuohy needle was inserted into the epidural space using a paramedian approach.   The epidural space was localized using loss of resistance along with counter oblique bi-planar fluoroscopic views.  After negative aspirate for air, blood, and CSF, a 2 ml. volume of Isovue-250 was injected into the epidural space and the flow of contrast was observed. Radiographs were obtained for documentation purposes.    The injectate was administered into the level noted above.   Additional Comments:  The patient tolerated the procedure well Dressing: 2 x 2 sterile gauze and Band-Aid    Post-procedure details: Patient was observed during the procedure. Post-procedure instructions were reviewed.  Patient left the clinic in stable  condition.

## 2022-05-20 ENCOUNTER — Ambulatory Visit: Payer: Managed Care, Other (non HMO) | Admitting: Orthopaedic Surgery

## 2022-05-27 ENCOUNTER — Encounter: Payer: Managed Care, Other (non HMO) | Admitting: Physical Medicine and Rehabilitation

## 2022-06-13 ENCOUNTER — Other Ambulatory Visit: Payer: Self-pay

## 2022-06-13 DIAGNOSIS — M5416 Radiculopathy, lumbar region: Secondary | ICD-10-CM

## 2022-06-24 ENCOUNTER — Other Ambulatory Visit: Payer: Self-pay | Admitting: Orthopaedic Surgery

## 2022-06-24 ENCOUNTER — Other Ambulatory Visit: Payer: Self-pay | Admitting: Physical Medicine and Rehabilitation

## 2022-06-24 DIAGNOSIS — M5116 Intervertebral disc disorders with radiculopathy, lumbar region: Secondary | ICD-10-CM

## 2022-06-24 MED ORDER — HYDROCODONE-ACETAMINOPHEN 5-325 MG PO TABS: 1.0000 | ORAL_TABLET | Freq: Three times a day (TID) | ORAL | 0 refills | Status: DC | PRN

## 2022-06-24 NOTE — Progress Notes (Signed)
Put order in for repeat injection

## 2022-07-17 ENCOUNTER — Ambulatory Visit (INDEPENDENT_AMBULATORY_CARE_PROVIDER_SITE_OTHER): Payer: BC Managed Care – PPO | Admitting: Physical Medicine and Rehabilitation

## 2022-07-17 DIAGNOSIS — M5416 Radiculopathy, lumbar region: Secondary | ICD-10-CM

## 2022-07-17 DIAGNOSIS — M4726 Other spondylosis with radiculopathy, lumbar region: Secondary | ICD-10-CM | POA: Diagnosis not present

## 2022-07-17 DIAGNOSIS — M47816 Spondylosis without myelopathy or radiculopathy, lumbar region: Secondary | ICD-10-CM | POA: Diagnosis not present
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
## 2022-07-18 ENCOUNTER — Encounter: Payer: Self-pay | Admitting: Physical Medicine and Rehabilitation

## 2022-08-27 ENCOUNTER — Encounter: Payer: BLUE CROSS/BLUE SHIELD | Admitting: Physical Medicine and Rehabilitation

## 2022-09-02 ENCOUNTER — Ambulatory Visit: Payer: Self-pay

## 2022-09-02 ENCOUNTER — Ambulatory Visit (INDEPENDENT_AMBULATORY_CARE_PROVIDER_SITE_OTHER): Payer: BLUE CROSS/BLUE SHIELD | Admitting: Physical Medicine and Rehabilitation

## 2022-09-02 VITALS — BP 153/93 | HR 64

## 2022-09-02 DIAGNOSIS — M5416 Radiculopathy, lumbar region: Secondary | ICD-10-CM

## 2022-09-02 MED ORDER — METHYLPREDNISOLONE ACETATE 80 MG/ML IJ SUSP
80.0000 mg | Freq: Once | INTRAMUSCULAR | Status: AC
  Administered 2022-09-02: 80 mg

## 2022-09-02 NOTE — Patient Instructions (Signed)

## 2022-09-02 NOTE — Progress Notes (Signed)
Functional Pain Scale - descriptive words and definitions  Distracting (5)    Aware of pain/able to complete some ADL's but limited by pain/sleep is affected and active distractions are only slightly useful. Moderate range order  Average Pain 6   +Driver, -BT, -Dye Allergies.  Lower back pain on left that radiates down the left leg at times. Standing makes pain worse

## 2022-09-08 NOTE — Progress Notes (Signed)
Roger Brown - 42 y.o. male MRN [id number]  Date of birth: April 15, 1981  Office Visit Note: Visit Date: 09/02/2022 PCP: Owens Loffler, MD Referred by: Owens Loffler, MD  Subjective: Chief Complaint  Patient presents with   Lower Back - Pain   HPI:  Roger Brown is a 42 y.o. male who comes in today for planned repeat Left L4-5  Lumbar Interlaminar epidural steroid injection with fluoroscopic guidance.  The patient has failed conservative care including home exercise, medications, time and activity modification.  This injection will be diagnostic and hopefully therapeutic.  Please see requesting physician notes for further details and justification. Patient received more than 50% pain relief from prior injection.   Referring: Dr. Jean Rosenthal   ROS Otherwise per HPI.  Assessment & Plan: Visit Diagnoses:    ICD-10-CM   1. Lumbar radiculopathy  M54.16 XR C-ARM NO REPORT    Epidural Steroid injection    methylPREDNISolone acetate (DEPO-MEDROL) injection 80 mg      Plan: No additional findings.   Meds & Orders:  Meds ordered this encounter  Medications   methylPREDNISolone acetate (DEPO-MEDROL) injection 80 mg    Orders Placed This Encounter  Procedures   XR C-ARM NO REPORT   Epidural Steroid injection    Follow-up: Return for visit to requesting provider as needed.   Procedures: No procedures performed  Lumbar Epidural Steroid Injection - Interlaminar Approach with Fluoroscopic Guidance  Patient: Roger Brown      Date of Birth: Apr 26, 1981 MRN: [id number] PCP: Owens Loffler, MD      Visit Date: 09/02/2022   Universal Protocol:     Consent Given By: the patient  Position: PRONE  Additional Comments: Vital signs were monitored before and after the procedure. Patient was prepped and draped in the usual sterile fashion. The correct patient, procedure, and site was verified.   Injection Procedure Details:   Procedure diagnoses: Lumbar  radiculopathy [M54.16]   Meds Administered:  Meds ordered this encounter  Medications   methylPREDNISolone acetate (DEPO-MEDROL) injection 80 mg     Laterality: Left  Location/Site:  L4-5  Needle: 3.5 in., 20 ga. Tuohy  Needle Placement: Paramedian epidural  Findings:   -Comments: Excellent flow of contrast into the epidural space.  Procedure Details: Using a paramedian approach from the side mentioned above, the region overlying the inferior lamina was localized under fluoroscopic visualization and the soft tissues overlying this structure were infiltrated with 4 ml. of 1% Lidocaine without Epinephrine. The Tuohy needle was inserted into the epidural space using a paramedian approach.   The epidural space was localized using loss of resistance along with counter oblique bi-planar fluoroscopic views.  After negative aspirate for air, blood, and CSF, a 2 ml. volume of Isovue-250 was injected into the epidural space and the flow of contrast was observed. Radiographs were obtained for documentation purposes.    The injectate was administered into the level noted above.   Additional Comments:  The patient tolerated the procedure well Dressing: 2 x 2 sterile gauze and Band-Aid    Post-procedure details: Patient was observed during the procedure. Post-procedure instructions were reviewed.  Patient left the clinic in stable condition.   Clinical History: MRI LUMBAR SPINE WITHOUT CONTRAST   TECHNIQUE: Multiplanar, multisequence MR imaging of the lumbar spine was performed. No intravenous contrast was administered.   COMPARISON:  Lumbar spine x-rays dated March 03, 2022.   FINDINGS: Segmentation:  Standard.   Alignment:  Trace retrolisthesis at L3-L4.  Vertebrae:  No fracture, evidence of discitis, or bone lesion.   Conus medullaris and cauda equina: Conus extends to the L1 level. Conus and cauda equina appear normal.   Paraspinal and other soft tissues:  Negative.   Disc levels:   T12-L1 to L2-L3:  Negative.   L3-L4: Mild disc bulging with superimposed left subarticular and foraminal disc protrusion. Left lateral recess stenosis with posterior displacement of the descending left L4 nerve root. Mild left neuroforaminal stenosis with encroachment on the exiting left L3 nerve root. No spinal canal or right neuroforaminal stenosis.   L4-L5: Mild disc bulging with superimposed small left foraminal disc protrusion and annular fissure. Mild bilateral facet arthropathy. Mild left neuroforaminal stenosis. No spinal canal or right neuroforaminal stenosis.   L5-S1: Negative disc. Mild bilateral facet arthropathy. No stenosis.   IMPRESSION: 1. 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 Left subarticular and foraminal disc protrusion at L3-L4 potentially affecting and irritating the descending left L4 and exiting left L3 nerve roots. 2. Small left foraminal disc protrusion at L4-L5 with mild left neuroforaminal stenosis. 3. No obvious right-sided impingement.     Electronically Signed   By: Titus Dubin M.D.   On: 05/05/2022 10:51     Objective:  VS:  HT:    WT:   BMI:     BP:(!) 153/93  HR:64bpm  TEMP: ( )  RESP:  Physical Exam Vitals and nursing note reviewed.  Constitutional:      General: He is not in acute distress.    Appearance: Normal appearance. He is not ill-appearing.  HENT:     Head: Normocephalic and atraumatic.     Right Ear: External ear normal.     Left Ear: External ear normal.     Nose: No congestion.  Eyes:     Extraocular Movements: Extraocular movements intact.  Cardiovascular:     Rate and Rhythm: Normal rate.     Pulses: Normal pulses.  Pulmonary:     Effort: Pulmonary effort is normal. No respiratory distress.  Abdominal:     General: There is no distension.     Palpations: Abdomen is soft.  Musculoskeletal:        General: No tenderness or signs of injury.      Cervical back: Neck supple.     Right lower leg: No edema.     Left lower leg: No edema.     Comments: Patient has good distal strength without clonus.  Skin:    Findings: No erythema or rash.  Neurological:     General: No focal deficit present.     Mental Status: He is alert and oriented to person, place, and time.     Sensory: No sensory deficit.     Motor: No weakness or abnormal muscle tone.     Coordination: Coordination normal.  Psychiatric:        Mood and Affect: Mood normal.        Behavior: Behavior normal.      Imaging: No results found.

## 2022-09-08 NOTE — Procedures (Signed)
Lumbar Epidural Steroid Injection - Interlaminar Approach with Fluoroscopic Guidance  Patient: Roger Brown      Date of Birth: 20-Nov-1980 MRN: [id number] PCP: Owens Loffler, MD      Visit Date: 09/02/2022   Universal Protocol:     Consent Given By: the patient  Position: PRONE  Additional Comments: Vital signs were monitored before and after the procedure. Patient was prepped and draped in the usual sterile fashion. The correct patient, procedure, and site was verified.   Injection Procedure Details:   Procedure diagnoses: Lumbar radiculopathy [M54.16]   Meds Administered:  Meds ordered this encounter  Medications   methylPREDNISolone acetate (DEPO-MEDROL) injection 80 mg     Laterality: Left  Location/Site:  L4-5  Needle: 3.5 in., 20 ga. Tuohy  Needle Placement: Paramedian epidural  Findings:   -Comments: Excellent flow of contrast into the epidural space.  Procedure Details: Using a paramedian approach from the side mentioned above, the region overlying the inferior lamina was localized under fluoroscopic visualization and the soft tissues overlying this structure were infiltrated with 4 ml. of 1% Lidocaine without Epinephrine. The Tuohy needle was inserted into the epidural space using a paramedian approach.   The epidural space was localized using loss of resistance along with counter oblique bi-planar fluoroscopic views.  After negative aspirate for air, blood, and CSF, a 2 ml. volume of Isovue-250 was injected into the epidural space and the flow of contrast was observed. Radiographs were obtained for documentation purposes.    The injectate was administered into the level noted above.   Additional Comments:  The patient tolerated the procedure well Dressing: 2 x 2 sterile gauze and Band-Aid    Post-procedure details: Patient was observed during the procedure. Post-procedure instructions were reviewed.  Patient left the clinic in stable condition.

## 2022-11-18 ENCOUNTER — Other Ambulatory Visit (INDEPENDENT_AMBULATORY_CARE_PROVIDER_SITE_OTHER): Payer: BLUE CROSS/BLUE SHIELD

## 2022-11-18 ENCOUNTER — Other Ambulatory Visit: Payer: Self-pay | Admitting: Family Medicine

## 2022-11-18 DIAGNOSIS — M1000 Idiopathic gout, unspecified site: Secondary | ICD-10-CM

## 2022-11-18 DIAGNOSIS — Z79899 Other long term (current) drug therapy: Secondary | ICD-10-CM | POA: Diagnosis not present

## 2022-11-18 DIAGNOSIS — Z131 Encounter for screening for diabetes mellitus: Secondary | ICD-10-CM

## 2022-11-18 DIAGNOSIS — E785 Hyperlipidemia, unspecified: Secondary | ICD-10-CM

## 2022-11-18 LAB — LIPID PANEL
Cholesterol: 200 mg/dL (ref 0–200)
HDL: 46.2 mg/dL (ref >39.00)
LDL Cholesterol: 129 mg/dL — ABNORMAL HIGH (ref 0–99)
NonHDL: 154.16
Total CHOL/HDL Ratio: 4
Triglycerides: 127 mg/dL (ref 0.0–149.0)
VLDL: 25.4 mg/dL (ref 0.0–40.0)

## 2022-11-18 LAB — CBC WITH DIFFERENTIAL/PLATELET
Basophils Absolute: 0 10*3/uL (ref 0.0–0.1)
Basophils Relative: 0.7 % (ref 0.0–3.0)
Eosinophils Absolute: 0.1 10*3/uL (ref 0.0–0.7)
Eosinophils Relative: 1.5 % (ref 0.0–5.0)
HCT: 43.5 % (ref 39.0–52.0)
Hemoglobin: 14.9 g/dL (ref 13.0–17.0)
Lymphocytes Relative: 47.1 % — ABNORMAL HIGH (ref 12.0–46.0)
Lymphs Abs: 2.1 10*3/uL (ref 0.7–4.0)
MCHC: 34.3 g/dL (ref 30.0–36.0)
MCV: 85.3 fl (ref 78.0–100.0)
Monocytes Absolute: 0.3 10*3/uL (ref 0.1–1.0)
Monocytes Relative: 6.4 % (ref 3.0–12.0)
Neutro Abs: 2 10*3/uL (ref 1.4–7.7)
Neutrophils Relative %: 44.3 % (ref 43.0–77.0)
Platelets: 313 10*3/uL (ref 150.0–400.0)
RBC: 5.1 Mil/uL (ref 4.22–5.81)
RDW: 13.9 % (ref 11.5–15.5)
WBC: 4.5 10*3/uL (ref 4.0–10.5)

## 2022-11-18 LAB — BASIC METABOLIC PANEL
BUN: 9 mg/dL (ref 6–23)
CO2: 28 mEq/L (ref 19–32)
Calcium: 9.6 mg/dL (ref 8.4–10.5)
Chloride: 103 mEq/L (ref 96–112)
Creatinine, Ser: 0.88 mg/dL (ref 0.40–1.50)
GFR: 107.35 mL/min (ref >60.00)
Glucose, Bld: 92 mg/dL (ref 70–99)
Potassium: 4.2 mEq/L (ref 3.5–5.1)
Sodium: 140 mEq/L (ref 135–145)

## 2022-11-18 LAB — HEPATIC FUNCTION PANEL
ALT: 15 U/L (ref 0–53)
AST: 15 U/L (ref 0–37)
Albumin: 4.6 g/dL (ref 3.5–5.2)
Alkaline Phosphatase: 62 U/L (ref 39–117)
Bilirubin, Direct: 0.1 mg/dL (ref 0.0–0.3)
Total Bilirubin: 0.6 mg/dL (ref 0.2–1.2)
Total Protein: 7.2 g/dL (ref 6.0–8.3)

## 2022-11-18 LAB — HEMOGLOBIN A1C: Hgb A1c MFr Bld: 5 % (ref 4.6–6.5)

## 2022-11-18 LAB — URIC ACID: Uric Acid, Serum: 6.8 mg/dL (ref 4.0–7.8)

## 2022-11-26 ENCOUNTER — Encounter: Payer: Managed Care, Other (non HMO) | Admitting: Family Medicine

## 2022-12-22 ENCOUNTER — Encounter: Payer: BLUE CROSS/BLUE SHIELD | Admitting: Family Medicine

## 2023-04-28 ENCOUNTER — Ambulatory Visit: Payer: BLUE CROSS/BLUE SHIELD | Admitting: Sports Medicine

## 2023-04-29 ENCOUNTER — Ambulatory Visit (INDEPENDENT_AMBULATORY_CARE_PROVIDER_SITE_OTHER): Payer: BLUE CROSS/BLUE SHIELD | Admitting: Orthopaedic Surgery

## 2023-04-29 ENCOUNTER — Encounter: Payer: Self-pay | Admitting: Orthopaedic Surgery

## 2023-04-29 DIAGNOSIS — M25511 Pain in right shoulder: Secondary | ICD-10-CM

## 2023-04-29 MED ORDER — HYDROCODONE-ACETAMINOPHEN 5-325 MG PO TABS: 1.0000 | ORAL_TABLET | Freq: Four times a day (QID) | ORAL | 0 refills | Status: AC | PRN

## 2023-04-29 MED ORDER — METHYLPREDNISOLONE ACETATE 40 MG/ML IJ SUSP
40.0000 mg | INTRAMUSCULAR | Status: AC | PRN
Start: 2023-04-29 — End: 2023-04-29
  Administered 2023-04-29: 40 mg via INTRA_ARTICULAR

## 2023-04-29 MED ORDER — LIDOCAINE HCL 1 % IJ SOLN
3.0000 mL | INTRAMUSCULAR | Status: AC | PRN
Start: 2023-04-29 — End: 2023-04-29
  Administered 2023-04-29: 3 mL

## 2023-04-29 NOTE — Progress Notes (Signed)
Roger Brown comes in today with about a week history of worsening right shoulder pain with no known injury.  It hurts with overhead activities and reaching across.  It started at night and is keeping him up at night.  He says internal rotation is very painful.  He says he cannot sleep.  He denies any neck pain as well.  He has not injured the shoulder before.  He is not having injections in the shoulder either.  Exam he does show he has significant signs of impingement of the right shoulder.  The rotator cuff is not truly weak at all and he can abduct his shoulder but is just very painful for him to do so.  I did provide a steroid injection in the subacromial outlet per his request I think this will help him quite a bit.  He will continue anti-inflammatories and we will send in just a few hydrocodone to help him.  If things are not improving after few weeks he knows to let us know.    Procedure Note  Patient: Roger Brown             Date of Birth: [birth year]           MRN: [id number]             Visit Date: 04/29/2023  Procedures: Visit Diagnoses:  1. Acute pain of right shoulder     Large Joint Inj: R subacromial bursa on 04/29/2023 1:02 PM Indications: pain and diagnostic evaluation Details: 22 G 1.5 in needle  Arthrogram: No  Medications: 3 mL lidocaine 1 %; 40 mg methylPREDNISolone acetate 40 MG/ML Outcome: tolerated well, no immediate complications Procedure, treatment alternatives, risks and benefits explained, specific risks discussed. Consent was given by the patient. Immediately prior to procedure a time out was called to verify the correct patient, procedure, equipment, support staff and site/side marked as required. Patient was prepped and draped in the usual sterile fashion.

## 2024-04-25 ENCOUNTER — Encounter: Payer: Self-pay | Admitting: Radiology
